# Patient Record
Sex: Female | Born: 1980 | Race: White | Hispanic: No | Marital: Married | Smoking: Current every day smoker
Health system: Southern US, Community
[De-identification: ages and names within clinical notes are randomized; demographics above are authoritative.]

## PROBLEM LIST (undated history)

## (undated) DIAGNOSIS — F329 Major depressive disorder, single episode, unspecified: Secondary | ICD-10-CM

## (undated) DIAGNOSIS — M797 Fibromyalgia: Secondary | ICD-10-CM

## (undated) DIAGNOSIS — G43909 Migraine, unspecified, not intractable, without status migrainosus: Secondary | ICD-10-CM

## (undated) DIAGNOSIS — K589 Irritable bowel syndrome without diarrhea: Secondary | ICD-10-CM

## (undated) DIAGNOSIS — E041 Nontoxic single thyroid nodule: Secondary | ICD-10-CM

## (undated) DIAGNOSIS — E0500 Thyrotoxicosis with diffuse goiter without thyrotoxic crisis or storm: Secondary | ICD-10-CM

## (undated) DIAGNOSIS — J309 Allergic rhinitis, unspecified: Secondary | ICD-10-CM

## (undated) DIAGNOSIS — N3942 Incontinence without sensory awareness: Secondary | ICD-10-CM

## (undated) HISTORY — DX: Major depressive disorder, single episode, unspecified: F32.9

## (undated) HISTORY — PX: ABDOMINAL HYSTERECTOMY: SHX81

## (undated) HISTORY — DX: Thyrotoxicosis with diffuse goiter without thyrotoxic crisis or storm: E05.00

## (undated) HISTORY — PX: PARTIAL HYSTERECTOMY: SHX80

## (undated) HISTORY — PX: OOPHORECTOMY: SHX86

## (undated) HISTORY — DX: Allergic rhinitis, unspecified: J30.9

## (undated) HISTORY — PX: TUBAL LIGATION: SHX77

## (undated) HISTORY — PX: VAGINA SURGERY: SHX829

## (undated) HISTORY — DX: Incontinence without sensory awareness: N39.42

---

## 2017-12-19 ENCOUNTER — Other Ambulatory Visit (HOSPITAL_BASED_OUTPATIENT_CLINIC_OR_DEPARTMENT_OTHER): Payer: Self-pay | Admitting: Pain Medicine

## 2017-12-19 DIAGNOSIS — R4189 Other symptoms and signs involving cognitive functions and awareness: Secondary | ICD-10-CM

## 2017-12-22 ENCOUNTER — Encounter (HOSPITAL_BASED_OUTPATIENT_CLINIC_OR_DEPARTMENT_OTHER): Payer: Self-pay

## 2017-12-22 ENCOUNTER — Ambulatory Visit (HOSPITAL_BASED_OUTPATIENT_CLINIC_OR_DEPARTMENT_OTHER): Payer: BLUE CROSS/BLUE SHIELD

## 2018-01-05 ENCOUNTER — Ambulatory Visit (HOSPITAL_BASED_OUTPATIENT_CLINIC_OR_DEPARTMENT_OTHER)
Admission: RE | Admit: 2018-01-05 | Discharge: 2018-01-05 | Disposition: A | Discharge status: Home / Self Care | Payer: BLUE CROSS/BLUE SHIELD | Source: Ambulatory Visit | Attending: Pain Medicine | Admitting: Pain Medicine

## 2018-01-05 DIAGNOSIS — R4189 Other symptoms and signs involving cognitive functions and awareness: Secondary | ICD-10-CM | POA: Diagnosis not present

## 2018-01-05 IMAGING — MR MR HEAD W/O CM
7 of 8 series · 40 of 48 positions shown · non-contrast
Comparison: None.

CLINICAL DATA: Cognitive deficit.  Chronic migraine headaches.

EXAM:
MRI HEAD WITHOUT CONTRAST
TECHNIQUE: Multiplanar, multiecho pulse sequences of the brain and surrounding
structures were obtained without intravenous contrast.

[Series 2: T1 · sagittal · 5.0mm · 0.90mm/px · 3 of 27 slices shown]
[im 1/27]
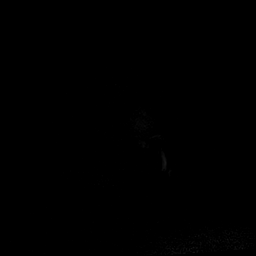
[im 14/27]
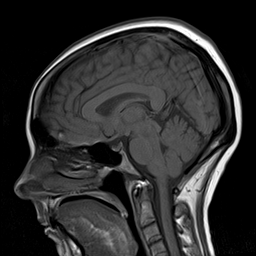
[im 27/27]
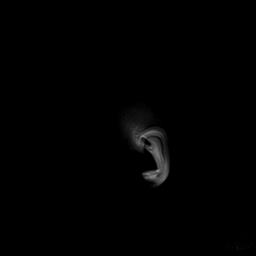

[Series 3: DWI · axial · 3.0mm · 1.88mm/px · z∈[-85,+66]mm · 13 of 96 slices shown (1 of 2)]
[im 1/96]
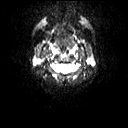
[im 8/96]
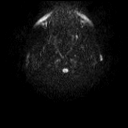
[im 16/96]
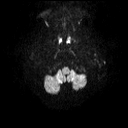
[im 24/96]
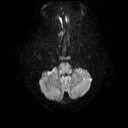
[im 32/96]
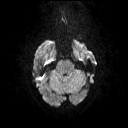
[im 40/96]
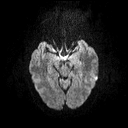
[im 48/96]
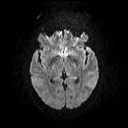
[im 56/96]
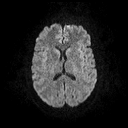
[im 64/96]
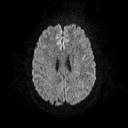
[im 72/96]
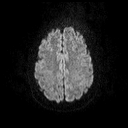
[im 80/96]
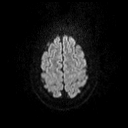
[im 88/96]
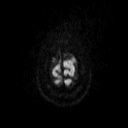
[im 96/96]
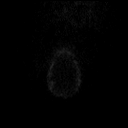

[Series 4: DWI · axial · 3.0mm · 1.88mm/px · z∈[-85,+66]mm · 6 of 48 slices shown (2 of 2)]
[im 1/48]
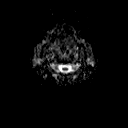
[im 10/48]
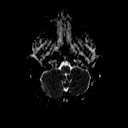
[im 19/48]
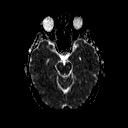
[im 29/48]
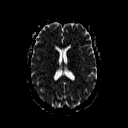
[im 38/48]
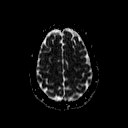
[im 48/48]
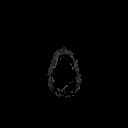

[Series 5: T2 · axial · 5.0mm · 0.69mm/px · z∈[-86,+72]mm · 4 of 28 slices shown (1 of 3)]
[im 1/28]
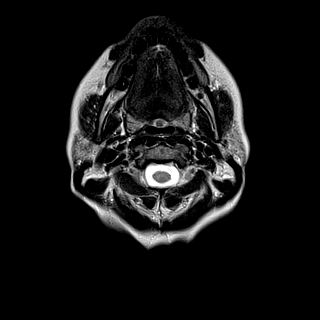
[im 10/28]
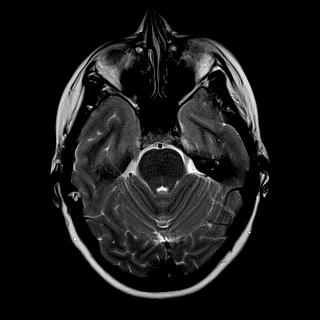
[im 19/28]
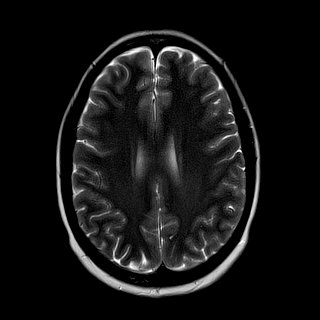
[im 28/28]
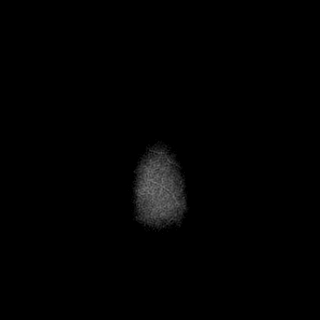

[Series 6: T2 · axial · 5.0mm · 0.43mm/px · z∈[-86,+72]mm · 4 of 28 slices shown (2 of 3)]
[im 1/28]
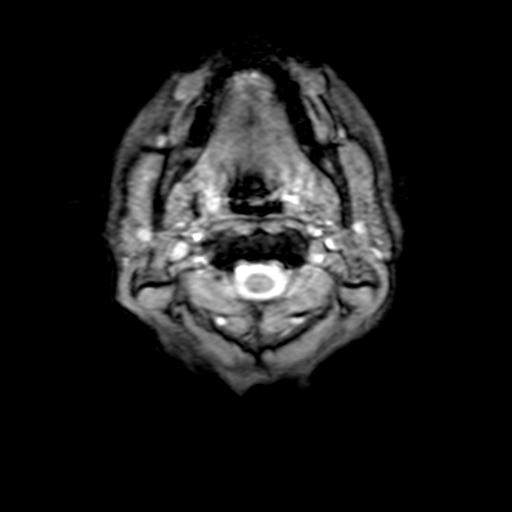
[im 10/28]
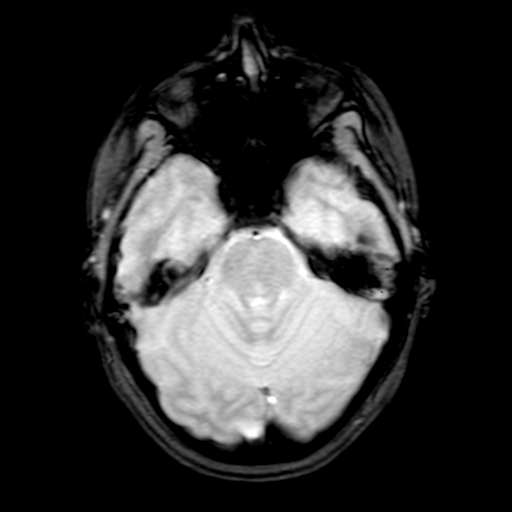
[im 19/28]
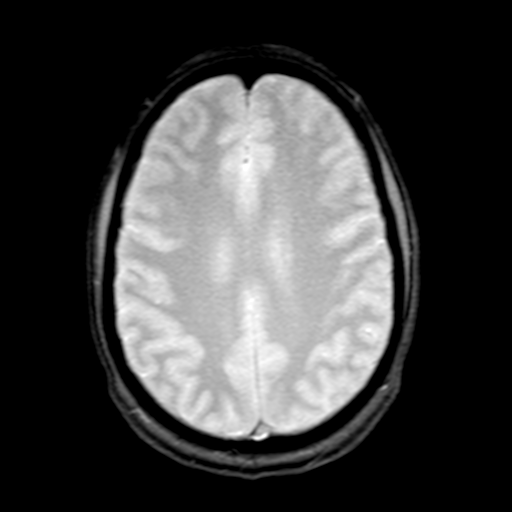
[im 28/28]
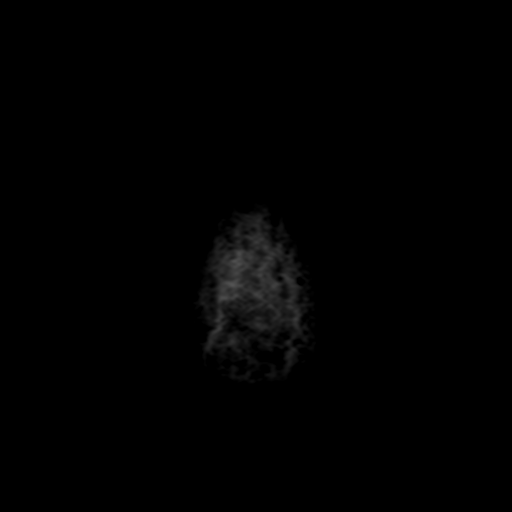

[Series 7: FLAIR · axial · 3.0mm · 0.43mm/px · z∈[-87,+73]mm · 6 of 42 slices shown]
[im 1/42]
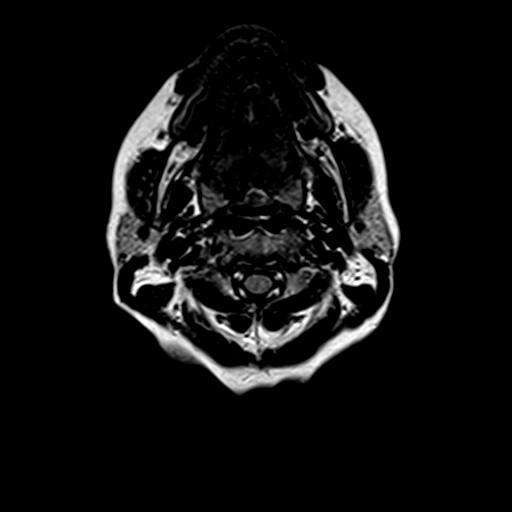
[im 9/42]
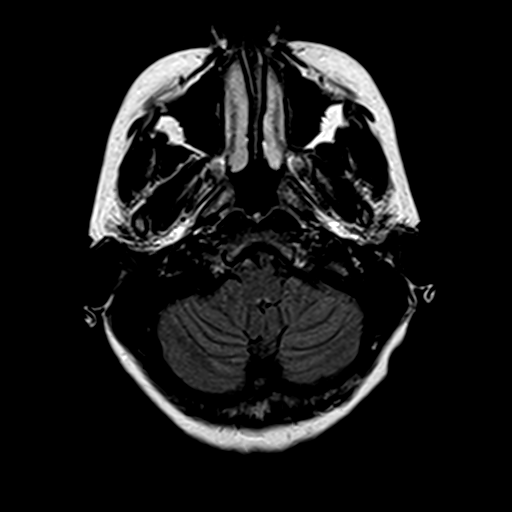
[im 17/42]
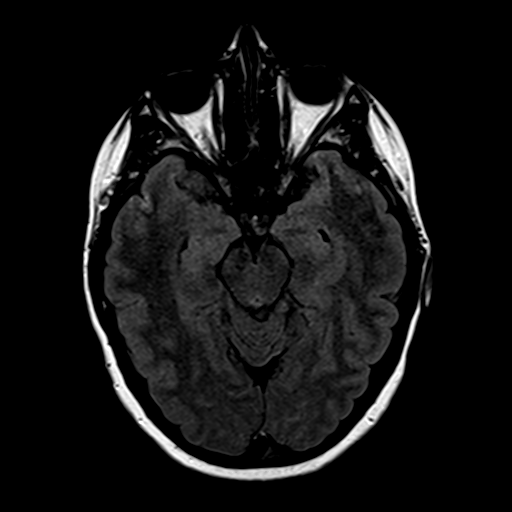
[im 25/42]
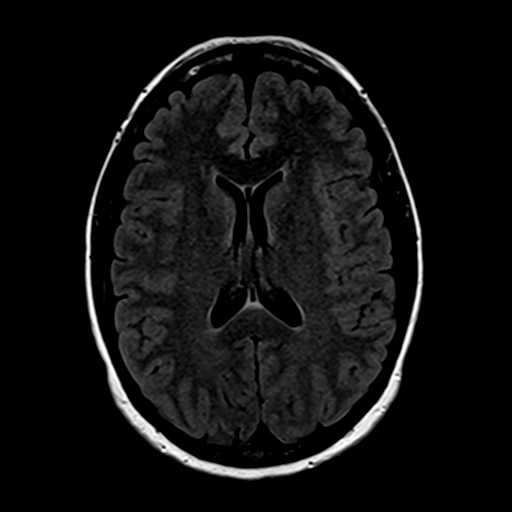
[im 33/42]
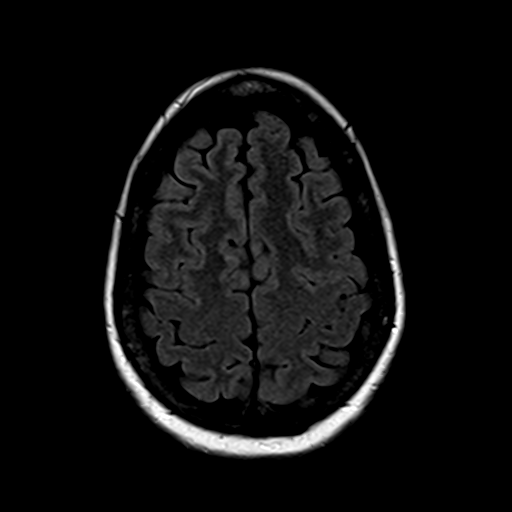
[im 42/42]
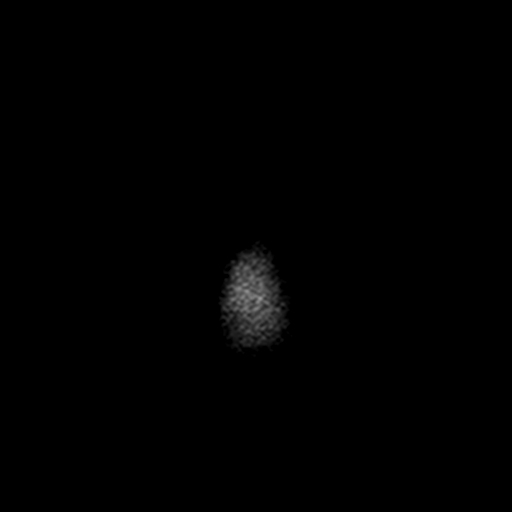

[Series 9: T2 · coronal · 5.0mm · 0.69mm/px · 4 of 30 slices shown (3 of 3)]
[im 1/30]
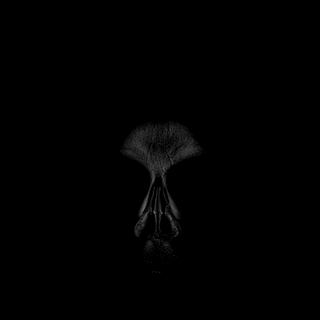
[im 10/30]
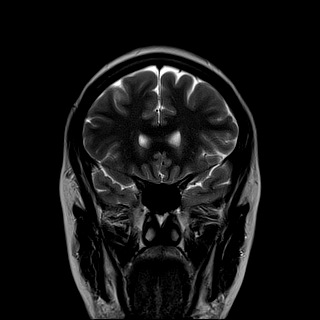
[im 20/30]
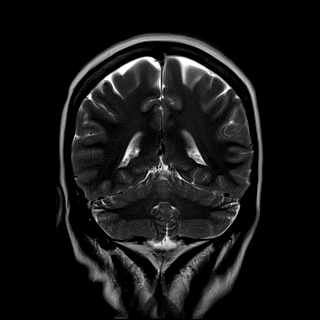
[im 30/30]
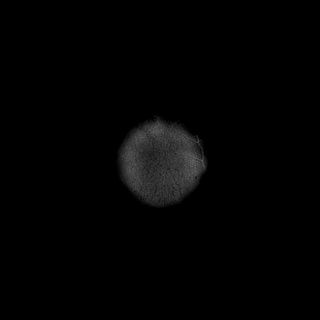

[40 of 48 positions shown; findings below may reference images not displayed]

FINDINGS: Brain: No acute infarction, hemorrhage, hydrocephalus, extra-axial
collection or mass lesion. Cerebral volume normal.

Vascular: Normal arterial flow voids

Skull and upper cervical spine: Negative

Sinuses/Orbits: Negative

Other: None
IMPRESSION: Normal MRI head.

## 2018-10-15 DIAGNOSIS — G43109 Migraine with aura, not intractable, without status migrainosus: Secondary | ICD-10-CM

## 2018-10-15 DIAGNOSIS — G43E09 Chronic migraine with aura, not intractable, without status migrainosus: Secondary | ICD-10-CM

## 2018-10-15 HISTORY — DX: Chronic migraine with aura, not intractable, without status migrainosus: G43.E09

## 2018-10-15 HISTORY — DX: Migraine with aura, not intractable, without status migrainosus: G43.109

## 2019-01-04 ENCOUNTER — Emergency Department (HOSPITAL_COMMUNITY): Payer: BLUE CROSS/BLUE SHIELD

## 2019-01-04 ENCOUNTER — Other Ambulatory Visit: Payer: Self-pay

## 2019-01-04 ENCOUNTER — Encounter (HOSPITAL_COMMUNITY): Payer: Self-pay | Admitting: *Deleted

## 2019-01-04 ENCOUNTER — Emergency Department (HOSPITAL_COMMUNITY)
Admission: EM | Admit: 2019-01-04 | Discharge: 2019-01-05 | Disposition: A | Discharge status: Home / Self Care | Payer: BLUE CROSS/BLUE SHIELD | Attending: Emergency Medicine | Admitting: Emergency Medicine

## 2019-01-04 DIAGNOSIS — J189 Pneumonia, unspecified organism: Secondary | ICD-10-CM | POA: Diagnosis not present

## 2019-01-04 DIAGNOSIS — K921 Melena: Secondary | ICD-10-CM | POA: Diagnosis not present

## 2019-01-04 DIAGNOSIS — J029 Acute pharyngitis, unspecified: Secondary | ICD-10-CM | POA: Diagnosis not present

## 2019-01-04 DIAGNOSIS — R197 Diarrhea, unspecified: Secondary | ICD-10-CM | POA: Diagnosis not present

## 2019-01-04 DIAGNOSIS — R1013 Epigastric pain: Secondary | ICD-10-CM | POA: Diagnosis not present

## 2019-01-04 DIAGNOSIS — R079 Chest pain, unspecified: Secondary | ICD-10-CM | POA: Diagnosis present

## 2019-01-04 DIAGNOSIS — Z8619 Personal history of other infectious and parasitic diseases: Secondary | ICD-10-CM | POA: Diagnosis not present

## 2019-01-04 HISTORY — DX: Migraine, unspecified, not intractable, without status migrainosus: G43.909

## 2019-01-04 HISTORY — DX: Fibromyalgia: M79.7

## 2019-01-04 HISTORY — DX: Irritable bowel syndrome, unspecified: K58.9

## 2019-01-04 HISTORY — DX: Nontoxic single thyroid nodule: E04.1

## 2019-01-04 LAB — COMPREHENSIVE METABOLIC PANEL
ALT: 14 U/L (ref 0–44)
AST: 17 U/L (ref 15–41)
Albumin: 3.8 g/dL (ref 3.5–5.0)
Alkaline Phosphatase: 104 U/L (ref 38–126)
Anion gap: 9 (ref 5–15)
BUN: <5 mg/dL — ABNORMAL LOW (ref 6–20)
CO2: 26 mmol/L (ref 22–32)
Calcium: 9 mg/dL (ref 8.9–10.3)
Chloride: 102 mmol/L (ref 98–111)
Creatinine, Ser: 0.93 mg/dL (ref 0.44–1.00)
GFR calc Af Amer: >60 mL/min (ref >60)
GFR calc non Af Amer: >60 mL/min (ref >60)
Glucose, Bld: 64 mg/dL — ABNORMAL LOW (ref 70–99)
Potassium: 3.9 mmol/L (ref 3.5–5.1)
Sodium: 137 mmol/L (ref 135–145)
Total Bilirubin: 0.5 mg/dL (ref 0.3–1.2)
Total Protein: 6.4 g/dL — ABNORMAL LOW (ref 6.5–8.1)

## 2019-01-04 LAB — CBC
HCT: 39.5 % (ref 36.0–46.0)
Hemoglobin: 13.6 g/dL (ref 12.0–15.0)
MCH: 32.7 pg (ref 26.0–34.0)
MCHC: 34.4 g/dL (ref 30.0–36.0)
MCV: 95 fL (ref 80.0–100.0)
Platelets: 365 10*3/uL (ref 150–400)
RBC: 4.16 MIL/uL (ref 3.87–5.11)
RDW: 14.6 % (ref 11.5–15.5)
WBC: 10.9 10*3/uL — ABNORMAL HIGH (ref 4.0–10.5)
nRBC: 0 % (ref 0.0–0.2)

## 2019-01-04 LAB — POC OCCULT BLOOD, ED: Fecal Occult Bld: NEGATIVE

## 2019-01-04 LAB — LIPASE, BLOOD: Lipase: 25 U/L (ref 11–51)

## 2019-01-04 LAB — D-DIMER, QUANTITATIVE: D-Dimer, Quant: 0.48 ug/mL-FEU (ref 0.00–0.50)

## 2019-01-04 LAB — I-STAT BETA HCG BLOOD, ED (MC, WL, AP ONLY): I-stat hCG, quantitative: <5 m[IU]/mL (ref <5)

## 2019-01-04 LAB — TROPONIN I (HIGH SENSITIVITY): Troponin I (High Sensitivity): <2 ng/L (ref <18)

## 2019-01-04 MED ORDER — SODIUM CHLORIDE 0.9% FLUSH: 3.0000 mL | Freq: Once | INTRAVENOUS | Status: DC

## 2019-01-04 MED ORDER — IBUPROFEN 800 MG PO TABS: 800.0000 mg | ORAL_TABLET | Freq: Three times a day (TID) | ORAL | 0 refills | Status: DC

## 2019-01-04 MED ORDER — PREDNISONE 10 MG (21) PO TBPK: ORAL_TABLET | Freq: Every day | ORAL | 0 refills | Status: DC

## 2019-01-04 MED ORDER — SODIUM CHLORIDE 0.9 % IV BOLUS
1000.0000 mL | Freq: Once | INTRAVENOUS | Status: AC
  Administered 2019-01-04: 1000 mL via INTRAVENOUS

## 2019-01-04 NOTE — ED Triage Notes (Signed)
Pt has been having intermittent sharp chest pain that has persisted as a dull pressure that is constant now. Reports dyspnea which pt appears sob when talking. Has been diagnosed with Covid x 2 over the past 2 weeks. Also has had sore throat, bloody stools, and epigastric pain

## 2019-01-04 NOTE — ED Provider Notes (Signed)
[facility] EMERGENCY DEPARTMENT Provider Note   CSN: [id number] Arrival date & time: 01/04/19  2044    History   Chief Complaint Chief Complaint  Patient presents with  . Chest Pain    HPI Lynn Lyons is a 38 y.o. female has medical history of fibromyalgia, IBS, migraines, external hemorrhoids presents emergency department today with multiple medical complaints. Of note patient has had 2+ tests for COVID.  First test was 11/29/2018 and the second test was 12/24/2018.  She has been self quarantining at home and was prescribed azithromycin that she finished approximately a month ago.   Chest pain x1 week.  Patient describes the chest pain as sharp.  Pain is located in the center of her chest.  The episodes of pain lasts approximately 2 to 3 minutes. No alleviating or modifying factors.  She is also now having epigastric pain that has been constant x2 days.  Patient states whenever she gets up to do something she feels like her heart is beating very fast and she feels short of breath.  She also feels short of breath when talking, denies worsening dyspnea with exertion.  Patient also reports diarrhea.  She has had 3 episodes of loose stool that she describes as light brown in color.  She noticed bright red blood in the toilet.   Also with sore throat. She describes as scratchy when she swallows. No radiation of pain, no difficulty swallowing, no voice changes.  Denies fever, chills, cough, hemoptysis, nausea, vomiting, difficulty breathing, history of blood clot, exogenous estrogen use, lower extremity pain or swelling, black tarry stool, recent travel or immobilization, history of PE or DVT, family or personal history of bleeding or clotting disorders.    Past Medical History:  Diagnosis Date  . Fibromyalgia   . IBS (irritable bowel syndrome)   . Migraines   . Thyroid cyst     There are no active problems to display for this patient.   Past Surgical History:   Procedure Laterality Date  . OOPHORECTOMY    . PARTIAL HYSTERECTOMY    . TUBAL LIGATION       OB History   No obstetric history on file.      Home Medications    Prior to Admission medications   Medication Sig Start Date End Date Taking? Authorizing Provider  ibuprofen (ADVIL) 800 MG tablet Take 1 tablet (800 mg total) by mouth 3 (three) times daily. 01/04/19   Ken Bonn E, PA-C  predniSONE (STERAPRED UNI-PAK 21 TAB) 10 MG (21) TBPK tablet Take by mouth daily. Take 6 tabs by mouth daily  for 2 days, then 5 tabs for 2 days, then 4 tabs for 2 days, then 3 tabs for 2 days, 2 tabs for 2 days, then 1 tab by mouth daily for 2 days 01/04/19   Nataki Mccrumb, Harley Hallmark, PA-C    Family History No family history on file.  Social History Social History   Tobacco Use  . Smoking status: Not on file  Substance Use Topics  . Alcohol use: Not on file  . Drug use: Not on file     Allergies   Patient has no known allergies.   Review of Systems Review of Systems  Constitutional: Negative for chills and fever.  HENT: Positive for sore throat. Negative for congestion, ear discharge, ear pain, sinus pressure and sinus pain.   Eyes: Negative for pain and redness.  Respiratory: Positive for shortness of breath. Negative for cough.   Cardiovascular:  Positive for chest pain.  Gastrointestinal: Positive for abdominal pain, blood in stool and nausea. Negative for constipation, diarrhea and vomiting.  Genitourinary: Negative for dysuria and hematuria.  Musculoskeletal: Negative for back pain and neck pain.  Skin: Negative for wound.  Neurological: Negative for weakness, numbness and headaches.     Physical Exam Updated Vital Signs BP (!) 118/103   Pulse (!) 105   Temp 99.2 F (37.3 C) (Oral)   Resp 20   SpO2 99%   Physical Exam Vitals signs and nursing note reviewed.  Constitutional:      General: She is not in acute distress.    Appearance: She is not ill-appearing.  HENT:      Head: Normocephalic and atraumatic.     Right Ear: Tympanic membrane and external ear normal.     Left Ear: Tympanic membrane and external ear normal.     Nose: Nose normal.     Mouth/Throat:     Mouth: Mucous membranes are moist.     Pharynx: Oropharynx is clear.     Comments: No erythema to oropharynx, no edema, no exudate, no tonsillar swelling, voice normal, neck supple without lymphadenopathy.  Eyes:     General: No scleral icterus.       Right eye: No discharge.        Left eye: No discharge.     Extraocular Movements: Extraocular movements intact.     Conjunctiva/sclera: Conjunctivae normal.     Pupils: Pupils are equal, round, and reactive to light.  Neck:     Musculoskeletal: Normal range of motion. No muscular tenderness.     Vascular: No JVD.  Cardiovascular:     Rate and Rhythm: Regular rhythm. Tachycardia present.     Pulses: Normal pulses.          Radial pulses are 2+ on the right side and 2+ on the left side.       Dorsalis pedis pulses are 2+ on the right side and 2+ on the left side.     Heart sounds: Normal heart sounds.  Pulmonary:     Comments: Lungs clear to auscultation in all fields. Symmetric chest rise. No wheezing, rales, or rhonchi. SpO2 is 100% on room air Chest:     Chest wall: Tenderness present.  Abdominal:     General: Bowel sounds are normal.     Tenderness: There is abdominal tenderness in the epigastric area. There is no right CVA tenderness or left CVA tenderness.     Comments: Abdomen is soft, non-distended. No rigidity, no guarding. No peritoneal signs.  Genitourinary:    Comments: Chaperone  NT present for exam. Digital Rectal Exam reveals sphincter with good tone. External hemorrhoids noted, no thrombosis. No masses or fissures. Stool color is brown with no overt blood. No gross melena.  Musculoskeletal: Normal range of motion.     Right lower leg: No edema.     Left lower leg: No edema.     Comments: Homans sign absent bilaterally, no  lower extremity edema, no palpable cords, compartments are soft  Lymphadenopathy:     Cervical: No cervical adenopathy.  Skin:    General: Skin is warm and dry.     Capillary Refill: Capillary refill takes less than 2 seconds.  Neurological:     Mental Status: She is oriented to person, place, and time.     GCS: GCS eye subscore is 4. GCS verbal subscore is 5. GCS motor subscore is 6.  Comments: Fluent speech, no facial droop.  Psychiatric:        Behavior: Behavior normal.      ED Treatments / Results  Labs (all labs ordered are listed, but only abnormal results are displayed) Labs Reviewed  CBC - Abnormal; Notable for the following components:      Result Value   WBC 10.9 (*)    All other components within normal limits  COMPREHENSIVE METABOLIC PANEL - Abnormal; Notable for the following components:   Glucose, Bld 64 (*)    BUN <5 (*)    Total Protein 6.4 (*)    All other components within normal limits  NOVEL CORONAVIRUS, NAA (HOSPITAL ORDER, SEND-OUT TO REF LAB)  LIPASE, BLOOD  D-DIMER, QUANTITATIVE (NOT AT [facility])  I-STAT BETA HCG BLOOD, ED ([facility], [facility], [facility] ONLY)  POC OCCULT BLOOD, ED  TROPONIN I (HIGH SENSITIVITY)    EKG EKG Interpretation  Date/Time:  Saturday January 04 2019 20:56:15 EDT Ventricular Rate:  114 PR Interval:  156 QRS Duration: 82 QT Interval:  330 QTC Calculation: 454 R Axis:   89 Text Interpretation:  Sinus tachycardia Otherwise normal ECG No previous ECGs available Confirmed by Wandra Arthurs [phone]) on 01/04/2019 10:36:12 PM   Radiology Dg Chest Portable 1 View  Result Date: 01/04/2019 CLINICAL DATA:  Initial evaluation for acute intermittent sharp chest pain, shortness of breath. Recently diagnosed with COVID. EXAM: PORTABLE CHEST 1 VIEW COMPARISON:  None. FINDINGS: Cardiac and mediastinal silhouettes are within normal limits. Lungs mildly hypoinflated. Scattered interstitial opacities present within the lower lobes bilaterally, suspected to  reflect sequelae of atypical lung infection given provided history. No pulmonary edema or pleural effusion. No pneumothorax. No acute osseous finding. IMPRESSION: Scattered interstitial opacities within the lower lobes bilaterally, suspected to reflect sequelae of atypical lung infection/viral pneumonitis given provided history. Electronically Signed   By: Jeannine Boga M.D.   On: 01/04/2019 22:46    Procedures Procedures (including critical care time)  Medications Ordered in ED Medications  sodium chloride 0.9 % bolus 1,000 mL (0 mLs Intravenous Stopped 01/05/19 0033)     Initial Impression / Assessment and Plan / ED Course  I have reviewed the triage vital signs and the nursing notes.  Pertinent labs & imaging results that were available during my care of the patient were reviewed by me and considered in my medical decision making (see chart for details).  38 yo female presents with multiple medical complaints.  On arrival she has low-grade fever of 99.2 and tachycardia to 119.  She is not hypoxic, not tachypneic.  Lungs are clear to auscultation in all fields and SpO2 is 99% on room air during exam.  Chest pain is reproducible on exam, tenderness to palpation of epigastric area without peritoneal signs. Rectal exam without gross melena.  DDx includes URI, pneumonia, COVID, PE, ACS, pancreatitis, GI bleed, anemia.  Work-up with CMP unremarkable.  Negative d-dimer.  CBC with leukocytosis of 10.9, lipase within normal range, fecal occult negative, troponin negative.  As chest pain has been going on for 1 week ACS is unlikely etiology. EKG without ischemic changes.  I ambulated patient around the exam room.  She did so with steady gait in no distress.  SpO2 was 97-100% on room air while ambulating.  Low suspicion for PE given negative dimer and physical exam findings. Chest xray viewed by me with scattered interstitial opacities within the lower lobes bilaterally. Radiologist reports  suspected sequelae of atypical lung infection/viral pneumonitis. As pt has  been afebrile at home and only temp of 99.2 in ED, no cough, clear lungs, no increased work of breathing will treat as pneumonitis with steroids and NSAIDs. Outpatient covid test sent, pt aware she needs to continue to selfquarantine until she has test result.  Tachycardia improved after fluids. Pt continues to have normal work of breathing on reassessment. I had lengthy discussion with pt about ED return precautions. Pt is stable for discharge home and agrees with plan. Pt will need close follow up with pcp. I recommend seeing pcp in 1-2 days for recheck, pt agrees to plan. Findings and plan of care discussed with supervising physician Dr. Mariane Masters.   Kiamesha Samet was evaluated in Emergency Department on 01/05/2019 for the symptoms described in the history of present illness. She was evaluated in the context of the global COVID-19 pandemic, which necessitated consideration that the patient might be at risk for infection with the SARS-CoV-2 virus that causes COVID-19. Institutional protocols and algorithms that pertain to the evaluation of patients at risk for COVID-19 are in a state of rapid change based on information released by regulatory bodies including the CDC and federal and state organizations. These policies and algorithms were followed during the patient's care in the ED.  This note was prepared using Dragon voice recognition software and may include unintentional dictation errors due to the inherent limitations of voice recognition software.     Final Clinical Impressions(s) / ED Diagnoses   Final diagnoses:  Nonspecific chest pain  Pneumonitis    ED Discharge Orders         Ordered    predniSONE (STERAPRED UNI-PAK 21 TAB) 10 MG (21) TBPK tablet  Daily     01/04/19 2359    ibuprofen (ADVIL) 800 MG tablet  3 times daily     01/04/19 2359           Cherre Robins, PA-C 01/05/19 1157     Varney Biles, MD 01/06/19 1205

## 2019-01-04 NOTE — Discharge Instructions (Addendum)
You have been seen today for chest pain, shortness of breath. Please read and follow all provided instructions. Return to the emergency room for worsening condition or new concerning symptoms including worsening shortness of breath or difficulty breathing, fever you cannot control, worsening chest pain  Your chest x-ray today showed possible pneumonitis.  This is treated with steroids and anti-inflammatories.  1. Medications:  -Prescription sent to your pharmacy for steroid Dosepak.  Please take this as prescribed. -Prescription also sent for ibuprofen.  Please take as prescribed.  Recommend you take with food as to not upset your stomach. Continue usual home medications Take medications as prescribed. Please review all of the medicines and only take them if you do not have an allergy to them.   2. Treatment: rest, drink plenty of fluids  3. Follow Up: Please follow up with your primary doctor in 1-2 days for discussion of your diagnoses and further evaluation after today's visit; call first thing Monday morning to schedule an appointment to be seen in the beginning of the week.. It is very important that you follow-up and have your vital signs rechecked and discuss your emergency room visit.  It is also a possibility that you have an allergic reaction to any of the medicines that you have been prescribed - Everybody reacts differently to medications and while MOST people have no trouble with most medicines, you may have a reaction such as nausea, vomiting, rash, swelling, shortness of breath. If this is the case, please stop taking the medicine immediately and contact your physician.  ?

## 2019-01-05 NOTE — ED Notes (Signed)
Discharge instructions and follow up care discussed with pt. Pt verbalized understanding no questions at this time. Pt to go home with significant other

## 2019-01-08 LAB — NOVEL CORONAVIRUS, NAA (HOSP ORDER, SEND-OUT TO REF LAB; TAT 18-24 HRS): SARS-CoV-2, NAA: NOT DETECTED

## 2019-06-17 DIAGNOSIS — F17210 Nicotine dependence, cigarettes, uncomplicated: Secondary | ICD-10-CM

## 2019-06-17 HISTORY — DX: Nicotine dependence, cigarettes, uncomplicated: F17.210

## 2019-07-03 DIAGNOSIS — G9331 Postviral fatigue syndrome: Secondary | ICD-10-CM | POA: Insufficient documentation

## 2019-07-03 DIAGNOSIS — G933 Postviral fatigue syndrome: Secondary | ICD-10-CM | POA: Insufficient documentation

## 2019-07-03 HISTORY — DX: Postviral fatigue syndrome: G93.31

## 2019-07-17 DIAGNOSIS — C4491 Basal cell carcinoma of skin, unspecified: Secondary | ICD-10-CM

## 2019-07-17 HISTORY — DX: Basal cell carcinoma of skin, unspecified: C44.91

## 2020-08-13 NOTE — Progress Notes (Signed)
Name: Lynn Lyons  MRN/ DOB: [id number], [birth year]    Age/ Sex: 40 y.o., female    PCP: Imagene Riches, NP   Reason for Endocrinology Evaluation: Hyperthyroidism     Date of Initial Endocrinology Evaluation: 08/16/2020     HPI: Lynn Lyons is a 40 y.o. female with a past medical history of fibromyalgia , IBS . The patient presented for initial endocrinology clinic visit on 08/16/2020 for consultative assistance with her Hyperthyroidism.   Pt diagnosed with hyperthyroidism with a TSH of 0.34 uIU/Ml with local neck swelling, tenderness and palpitation in 06/2020    She has noted thyromegaly ~ 2 yrs ago , but her uptake and scan was normal at the time.   Has Upper EGD for dysphagia, S/P stretching  She lost ~ 40 lbs sine last summer  Has occasional palpitations  Has frequent diarrhea  As well as fecal incontinence for years   Has tremors.   No Biotin   No FH of thyroid disease   S/P hystrectomy      HISTORY:  Past Medical History:  Past Medical History:  Diagnosis Date  . Fibromyalgia   . IBS (irritable bowel syndrome)   . Migraines   . Thyroid cyst    Past Surgical History:  Past Surgical History:  Procedure Laterality Date  . OOPHORECTOMY    . PARTIAL HYSTERECTOMY    . TUBAL LIGATION        Social History:  reports that she has been smoking cigarettes. She has never used smokeless tobacco. She reports current alcohol use.  Family History: family history is not on file.   HOME MEDICATIONS: Allergies as of 08/16/2020      Reactions   Moxifloxacin Shortness Of Breath   Amitriptyline Other (See Comments)   "Can't remember"   Gabapentin Other (See Comments)   Swelling   Milnacipran Other (See Comments)   "Can't remember"   Pregabalin    Other reaction(s): Other (See Comments) Swelling   Valproic Acid Swelling      Medication List       Accurate as of August 16, 2020  2:13 PM. If you have any questions, ask your nurse or doctor.         STOP taking these medications   ibuprofen 800 MG tablet Commonly known as: ADVIL Stopped by: Dorita Sciara, MD   predniSONE 10 MG (21) Tbpk tablet Commonly known as: STERAPRED UNI-PAK 21 TAB Stopped by: Dorita Sciara, MD     TAKE these medications   clindamycin 300 MG capsule Commonly known as: CLEOCIN Take 600 mg by mouth 2 (two) times daily.   cyclobenzaprine 10 MG tablet Commonly known as: FLEXERIL Take 10 mg by mouth 2 (two) times daily.   levocetirizine 5 MG tablet Commonly known as: XYZAL Take 1 tablet by mouth daily.   meloxicam 15 MG tablet Commonly known as: MOBIC Take 15 mg by mouth daily.   oxybutynin 10 MG 24 hr tablet Commonly known as: DITROPAN-XL Take 10 mg by mouth at bedtime.   traZODone 50 MG tablet Commonly known as: DESYREL TAKE 1/2 A TABLET BY MOUTH AT BEDTIME   venlafaxine XR 37.5 MG 24 hr capsule Commonly known as: EFFEXOR-XR Take 37.5 mg by mouth at bedtime.         REVIEW OF SYSTEMS: A comprehensive ROS was conducted with the patient and is negative except as per HPI    OBJECTIVE:  VS: BP (!) 142/82   Pulse 95  Ht 5\' 4"  (1.626 m)   Wt 162 lb 8 oz (73.7 kg)   SpO2 95%   BMI 27.89 kg/m    Wt Readings from Last 3 Encounters:  08/16/20 162 lb 8 oz (73.7 kg)     EXAM: General: Pt appears well and is in NAD  Eyes: External eye exam normal without stare, lid lag or exophthalmos.  EOM intact.  PERRL.  Neck: General: Supple without adenopathy. Thyroid: Thyroid size normal.  No goiter or nodules appreciated. No thyroid bruit.  Lungs: Clear with good BS bilat with no rales, rhonchi, or wheezes  Heart: Auscultation: RRR.  Abdomen: Normoactive bowel sounds, soft, nontender, without masses or organomegaly palpable  Extremities:  BL LE: No pretibial edema normal ROM and strength.  Skin: Hair: Texture and amount normal with gender appropriate distribution Skin Inspection: No rashes Skin Palpation: Skin  temperature, texture, and thickness normal to palpation  Neuro: Cranial nerves: II - XII grossly intact  Motor: Normal strength throughout DTRs: 2+ and symmetric in UE without delay in relaxation phase  Mental Status: Judgment, insight: Intact Orientation: Oriented to time, place, and person Mood and affect: No depression, anxiety, or agitation     DATA REVIEWED:   Results for Lynn Lyons, Lynn Lyons (MRN [id number]) as of 08/17/2020 13:51  Ref. Range 08/16/2020 12:04  TSH Latest Ref Range: 0.450 - 4.500 uIU/mL 0.311 (L)  T4,Free(Direct) Latest Ref Range: 0.82 - 1.77 ng/dL 1.04  Thyrotropin Receptor Ab Latest Ref Range: 0.00 - 1.75 IU/L 1.17      Thyroid uptake and scan 08/03/2020  Uniform uptake within enlarged thyroid gland  4-hr I-131 uptake 15.4%  24- hr I- 131 37 %   ASSESSMENT/PLAN/RECOMMENDATIONS:   1. Hyperthyroidism   - Secondary to Graves' Disease - Pt with non-specific symptoms that could be attributed to her thyroid  We discussed that Graves' Disease is a result of an autoimmune condition involving the thyroid.    We discussed with pt the benefits of methimazole in the Tx of hyperthyroidism, as well as the possible side effects/complications of anti-thyroid drug Tx (specifically detailing the rare, but serious side effect of agranulocytosis). She was informed of need for regular thyroid function monitoring while on methimazole to ensure appropriate dosage without over-treatment. As well, we discussed the possible side effects of methimazole including the chance of rash, the small chance of liver irritation/juandice and the <=1 in 300-400 chance of sudden onset agranulocytosis.  We discussed importance of going to ED promptly (and stopping methimazole) if shewere to develop significant fever with severe sore throat of other evidence of acute infection.     We extensively discussed the various treatment options for hyperthyroidism and Graves disease including ablation therapy with  radioactive iodine versus antithyroid drug treatment versus surgical therapy.  We recommended to the patient that we felt, at this time, that thionamide  therapy would be most optimal.  We discussed the various possible benefits versus side effects of the various therapies.   I carefully explained to the patient that one of the consequences of I-131 ablation treatment would likely be permanent hypothyroidism which would require long-term replacement therapy with LT4.  - She was provided with printed lab orders to be used in [place] in 6 weeks if needed  - Her TSH is slightly low but despite a normal FT4 and t3 , I would recommend  Proceeding with treatment given symptoms    Medications : Start Methimazole 5 mg, HALF a tablet daily    F/U in 3  months  Labs in 6 weeks    Signed electronically by: Mack Guise, MD  [facility] Endocrinology  [facility] [address] [place], [place] [address] Phone: [phone] FAX: [phone]   CC: Imagene Riches, [place] [address] Phone: [phone] Fax: [phone]   Return to Endocrinology clinic as below: Future Appointments  Date Time Provider [facility]  09/08/2020  8:00 AM Collier Salina, MD [facility] None  09/20/2020  9:00 AM Melvenia Beam, MD [facility] None  11/11/2020  8:30 AM Valentin Benney, Melanie Crazier, MD [facility] None  12/02/2020  2:00 PM Warren Danes, PA-C [facility]

## 2020-08-16 ENCOUNTER — Encounter: Payer: Self-pay | Admitting: Internal Medicine

## 2020-08-16 ENCOUNTER — Other Ambulatory Visit: Payer: Self-pay

## 2020-08-16 ENCOUNTER — Ambulatory Visit (INDEPENDENT_AMBULATORY_CARE_PROVIDER_SITE_OTHER): Payer: 59 | Admitting: Internal Medicine

## 2020-08-16 VITALS — BP 142/82 | HR 95 | Ht 64.0 in | Wt 162.5 lb

## 2020-08-16 DIAGNOSIS — E0590 Thyrotoxicosis, unspecified without thyrotoxic crisis or storm: Secondary | ICD-10-CM

## 2020-08-16 DIAGNOSIS — E0500 Thyrotoxicosis with diffuse goiter without thyrotoxic crisis or storm: Secondary | ICD-10-CM | POA: Diagnosis not present

## 2020-08-16 NOTE — Patient Instructions (Signed)
-   Please stop by the lab today  

## 2020-08-17 DIAGNOSIS — E0500 Thyrotoxicosis with diffuse goiter without thyrotoxic crisis or storm: Secondary | ICD-10-CM | POA: Insufficient documentation

## 2020-08-17 DIAGNOSIS — E0590 Thyrotoxicosis, unspecified without thyrotoxic crisis or storm: Secondary | ICD-10-CM

## 2020-08-17 HISTORY — DX: Thyrotoxicosis, unspecified without thyrotoxic crisis or storm: E05.90

## 2020-08-17 LAB — THYROTROPIN RECEPTOR AUTOABS: Thyrotropin Receptor Ab: 1.17 IU/L (ref 0.00–1.75)

## 2020-08-17 LAB — TSH: TSH: 0.311 u[IU]/mL — ABNORMAL LOW (ref 0.450–4.500)

## 2020-08-17 LAB — T4, FREE: Free T4: 1.04 ng/dL (ref 0.82–1.77)

## 2020-08-17 MED ORDER — METHIMAZOLE 5 MG PO TABS: 2.5000 mg | ORAL_TABLET | Freq: Every day | ORAL | 1 refills | Status: DC

## 2020-08-27 ENCOUNTER — Other Ambulatory Visit: Payer: Self-pay | Admitting: Family

## 2020-08-27 ENCOUNTER — Other Ambulatory Visit (HOSPITAL_COMMUNITY): Payer: Self-pay | Admitting: Family

## 2020-08-27 DIAGNOSIS — N3942 Incontinence without sensory awareness: Secondary | ICD-10-CM

## 2020-08-27 DIAGNOSIS — M545 Low back pain, unspecified: Secondary | ICD-10-CM

## 2020-08-31 ENCOUNTER — Ambulatory Visit (HOSPITAL_COMMUNITY)
Admission: RE | Admit: 2020-08-31 | Discharge: 2020-08-31 | Disposition: A | Discharge status: Home / Self Care | Payer: 59 | Source: Ambulatory Visit | Attending: Family | Admitting: Family

## 2020-08-31 ENCOUNTER — Other Ambulatory Visit: Payer: Self-pay

## 2020-08-31 DIAGNOSIS — M545 Low back pain, unspecified: Secondary | ICD-10-CM | POA: Insufficient documentation

## 2020-08-31 DIAGNOSIS — N3942 Incontinence without sensory awareness: Secondary | ICD-10-CM | POA: Diagnosis present

## 2020-09-01 ENCOUNTER — Other Ambulatory Visit: Payer: Self-pay | Admitting: Family

## 2020-09-01 DIAGNOSIS — N63 Unspecified lump in unspecified breast: Secondary | ICD-10-CM

## 2020-09-07 NOTE — Progress Notes (Signed)
Office Visit Note  Patient: Lynn Lyons             Date of Birth: [birth year]           MRN: [id number]             PCP: Imagene Riches, NP Referring: Imagene Riches, NP Visit Date: 09/08/2020   Subjective:  New Patient (Initial Visit) (Patient complains of fatigue, joint and muscle pain, and confusion/fibro fog. Patient also has symptoms related to Graves' Disease. )   History of Present Illness: Lynn Lyons is a 40 y.o. female with a history of migraines, allergic rhinitis, nephrolithiasis, PID, RMSF and chronic pain syndrome here for evaluation of fibromyalgia syndrome and ruling out other problems. She has symptoms for at least 3 years but significant worsening since around November of last year. She has pain in multiple sites especially in the back, upper arms, and knees but is more concerned with episodic weakness or numbness affecting her hands and feet. This is severe enough to stop her from being able to use her hands on tasks such as craft projcets or drop items she is holding. She has tried several medications for fibromyalgia or neuropathy including cymbalta, amitriptyline, gabapentin with frequent sensitivity and swelling reactions. She was most recently diagnosed with grave's disease with low TSH, diffuse enhanced uptake on thyroid scan and her symptoms. Also noted to have significantly low vitamin B12 consistent with this disorder.  Labs reviewed 06/2020 ANA neg RF neg TSH 0.344 fT3 wnl fT4 wnl CK wnl CMP wnl ESR 22 Uric acid 5.1 Vit B12 78 (200-900)    Activities of Daily Living:  Patient reports morning stiffness for several hours to all day.   Patient Reports nocturnal pain.  Difficulty dressing/grooming: Reports Difficulty climbing stairs: Reports Difficulty getting out of chair: Reports Difficulty using hands for taps, buttons, cutlery, and/or writing: Reports  Review of Systems  Constitutional: Positive for fatigue.  HENT: Positive for mouth  dryness and nose dryness. Negative for mouth sores.   Eyes: Positive for pain, itching, visual disturbance and dryness.  Respiratory: Positive for shortness of breath and difficulty breathing. Negative for cough and hemoptysis.   Cardiovascular: Positive for chest pain, palpitations and swelling in legs/feet.  Gastrointestinal: Positive for abdominal pain, constipation and diarrhea. Negative for blood in stool.  Endocrine: Negative for increased urination.  Genitourinary: Negative for painful urination.  Musculoskeletal: Positive for arthralgias, joint pain, joint swelling, myalgias, muscle weakness, morning stiffness, muscle tenderness and myalgias.  Skin: Positive for color change. Negative for rash and redness.  Allergic/Immunologic: Negative for susceptible to infections.  Neurological: Positive for dizziness, numbness, headaches, memory loss and weakness.  Hematological: Positive for swollen glands.  Psychiatric/Behavioral: Positive for confusion and sleep disturbance.    PMFS History:  Patient Active Problem List   Diagnosis Date Noted   Fibromyalgia syndrome 09/08/2020   Patellofemoral pain syndrome of both knees 09/08/2020   Dry mouth 09/08/2020   Graves disease 08/17/2020   Hyperthyroidism 08/17/2020   Post viral syndrome 07/03/2019   Cigarette smoker 06/17/2019   Chronic migraine with aura 10/15/2018    Past Medical History:  Diagnosis Date   Fibromyalgia    Graves disease    IBS (irritable bowel syndrome)    Migraines    Thyroid cyst     Family History  Problem Relation Age of Onset   Cancer Mother        Colorectal   Crohn's disease Mother    Hypertension  Mother    Past Surgical History:  Procedure Laterality Date   CHOLECYSTECTOMY  2004   OOPHORECTOMY     PARTIAL HYSTERECTOMY     SKIN CANCER EXCISION  2021   TUBAL LIGATION     Social History   Social History Narrative   Not on file    There is no immunization history on file  for this patient.   Objective: Vital Signs: BP 122/82 (BP Location: Left Arm, Patient Position: Sitting, Cuff Size: Normal)    Pulse (!) 102    Ht 5' 3" (1.6 m)    Wt 163 lb 3.2 oz (74 kg)    BMI 28.91 kg/m    Physical Exam HENT:     Right Ear: External ear normal.     Left Ear: External ear normal.     Mouth/Throat:     Mouth: Mucous membranes are moist.     Pharynx: Oropharynx is clear.  Eyes:     Conjunctiva/sclera: Conjunctivae normal.  Cardiovascular:     Rate and Rhythm: Regular rhythm. Tachycardia present.  Pulmonary:     Effort: Pulmonary effort is normal.     Breath sounds: Normal breath sounds.  Skin:    General: Skin is warm and dry.     Findings: No rash.  Neurological:     General: No focal deficit present.     Mental Status: She is alert.     Deep Tendon Reflexes: Reflexes normal.  Psychiatric:        Mood and Affect: Mood normal.    Musculoskeletal Exam:  Neck, shoulders, elbows full ROM Wrists full ROM no tenderness or swelling Fingers full ROM no tenderness or swelling Knees full ROM patellofemoral crepitus present without tenderness or swelling  Investigation: No additional findings.  Imaging: MR LUMBAR SPINE WO CONTRAST  Result Date: 09/01/2020 CLINICAL DATA:  Low back pain over the last 6 months. Incontinence. Bilateral leg numbness. EXAM: MRI LUMBAR SPINE WITHOUT CONTRAST TECHNIQUE: Multiplanar, multisequence MR imaging of the lumbar spine was performed. No intravenous contrast was administered. COMPARISON:  Radiography 01/02/2018 FINDINGS: Segmentation:  5 lumbar type vertebral bodies. Alignment:  Normal Vertebrae:  Normal Conus medullaris and cauda equina: Conus extends to the L1-2 level. Conus and cauda equina appear normal. Paraspinal and other soft tissues: Normal Disc levels: Intervertebral disc levels are normal. No disc degeneration, bulge or herniation. No stenosis of the canal or foramina. No facet arthropathy. Nerve root distribution within  the thecal sac within normal limits. IMPRESSION: Normal examination. Electronically Signed   By: Nelson Chimes M.D.   On: 09/01/2020 08:34    Recent Labs: Lab Results  Component Value Date   WBC 10.9 (H) 01/04/2019   HGB 13.6 01/04/2019   PLT 365 01/04/2019   NA 137 01/04/2019   K 3.9 01/04/2019   CL 102 01/04/2019   CO2 26 01/04/2019   GLUCOSE 64 (L) 01/04/2019   BUN <5 (L) 01/04/2019   CREATININE 0.93 01/04/2019   BILITOT 0.5 01/04/2019   ALKPHOS 104 01/04/2019   AST 17 01/04/2019   ALT 14 01/04/2019   PROT 6.4 (L) 01/04/2019   ALBUMIN 3.8 01/04/2019   CALCIUM 9.0 01/04/2019   GFRAA >60 01/04/2019    Speciality Comments: No specialty comments available.  Procedures:  No procedures performed Allergies: Moxifloxacin, Amitriptyline, Gabapentin, Milnacipran, Pregabalin, and Valproic acid   Assessment / Plan:     Visit Diagnoses: Fibromyalgia syndrome  I suspect the peripheral coordination change or episodes of weakness are related to  vitamin B12 deficiency and thyroid disease. She does also have generalized FMS symptoms especially fatigue, all over pain, headaches, irritable bowels. Unfortunately not tolerant of multiple medications for this. Recommended she review [place] fibroguide site for self-care recommendations.  Patellofemoral pain syndrome of both knees  Anterior knee pain provoked worst in highly flexed position and crepitus on exam. She already reports a fair amount of exercise including walking multiple dogs. Provided reference material on condition and treatment options such as brace or sleeve, ice, NSAIDs.  Dry mouth  Persistent dry mouth without any lesions or dental complications and no signs of sialadenitis. Recommend OTC supportive treatment at this time sugar free gum, lozenges, or biotene mouth spray or rinse for saliva stimulation.  Orders: No orders of the defined types were placed in this encounter.  No orders of the defined types  were placed in this encounter.    Follow-Up Instructions: No follow-ups on file.   Collier Salina, MD  Note - This record has been created using [employer].  Chart creation errors have been sought, but may not always  have been located. Such creation errors do not reflect on  the standard of medical care.

## 2020-09-08 ENCOUNTER — Other Ambulatory Visit: Payer: Self-pay

## 2020-09-08 ENCOUNTER — Ambulatory Visit: Payer: 59 | Admitting: Internal Medicine

## 2020-09-08 ENCOUNTER — Encounter: Payer: Self-pay | Admitting: Internal Medicine

## 2020-09-08 DIAGNOSIS — M222X1 Patellofemoral disorders, right knee: Secondary | ICD-10-CM

## 2020-09-08 DIAGNOSIS — R682 Dry mouth, unspecified: Secondary | ICD-10-CM

## 2020-09-08 DIAGNOSIS — M222X2 Patellofemoral disorders, left knee: Secondary | ICD-10-CM

## 2020-09-08 DIAGNOSIS — M797 Fibromyalgia: Secondary | ICD-10-CM

## 2020-09-08 HISTORY — DX: Dry mouth, unspecified: R68.2

## 2020-09-08 HISTORY — DX: Patellofemoral disorders, right knee: M22.2X1

## 2020-09-08 HISTORY — DX: Patellofemoral disorders, left knee: M22.2X2

## 2020-09-08 NOTE — Patient Instructions (Addendum)
I recommend checking out the [place] patient-centered guide for fibromyalgia and chronic pain management: [employer]arkStatistics.com.ee  I do not see evidence of inflammatory joint or skin disease changes on examination today. You have had recent laboratory testing that show not indicate any problems besides the thyroid disease and B12, which can cause neuropathy.  For dry mouth I recommend sugar free gums and lozenges can stimulate saliva production. Over the counter biotene mouth spray or mouth rinse also stimulate saliva production to help dry mouth.  The knee pain looks like a problem called chondrolmalacia patellae, or patellofemoral pain syndrome.  Patellofemoral Pain Syndrome  Patellofemoral pain syndrome is a condition in which the tissue (cartilage) on the underside of the kneecap (patella) softens or breaks down. This causes pain in the front of the knee. The condition is also called runner's knee or chondromalacia patella. Patellofemoral pain syndrome is most common in young adults who are active in sports. The knee is the largest joint in the body. The patella covers the front of the knee and is attached to muscles above and below the knee. The underside of the patella is covered with a smooth type of cartilage (synovium). The smooth surface helps the patella glide easily when you move your knee. Patellofemoral pain syndrome causes swelling in the joint linings and bone surfaces in the knee. What are the causes? This condition may be caused by:  Overuse of the knee.  Poor alignment of your knee joints.  Weak leg muscles.  A direct hit to your kneecap. What increases the risk? You are more likely to develop this condition if:  You do a lot of activities that can wear down your kneecap. These include: ? Running. ? Squatting. ? Climbing stairs.  You start a new physical activity or exercise program.  You wear shoes that do not fit well.  You do not have good leg  strength.  You are overweight. What are the signs or symptoms? The main symptom of this condition is knee pain. This may feel like a dull, aching pain underneath your patella, in the front of your knee. There may be a popping or cracking sound when you move your knee. Pain may get worse with:  Exercise.  Climbing stairs.  Running.  Jumping.  Squatting.  Kneeling.  Sitting for a long time.  Moving or pushing on your patella. How is this diagnosed? This condition may be diagnosed based on:  Your symptoms and medical history. You may be asked about your recent physical activities and which ones cause knee pain.  A physical exam. This may include: ? Moving your patella back and forth. ? Checking your range of knee motion. ? Having you squat or jump to see if you have pain. ? Checking the strength of your leg muscles.  Imaging tests to confirm the diagnosis. These may include an MRI of your knee. How is this treated? This condition may be treated at home with rest, ice, compression, and elevation (RICE).  Other treatments may include:  NSAIDs, such as ibuprofen.  Physical therapy to stretch and strengthen your leg muscles.  Shoe inserts (orthotics) to take stress off your knee.  A knee brace or knee support.  Adhesive tapes to the skin.  Surgery to remove damaged cartilage or move the patella to a better position. This is rare. Follow these instructions at home: If you have a brace:  Wear the brace as told by your health care provider. Remove it only as told by  your health care provider.  Loosen the brace if your toes tingle, become numb, or turn cold and blue.  Keep the brace clean.  If the brace is not waterproof: ? Do not let it get wet. ? Cover it with a watertight covering when you take a bath or a shower. Managing pain, stiffness, and swelling  If directed, put ice on the painful area. To do this: ? If you have a removable brace, remove it as told by  your health care provider. ? Put ice in a plastic bag. ? Place a towel between your skin and the bag. ? Leave the ice on for 20 minutes, 2-3 times a day. ? Remove the ice if your skin turns bright red. This is very important. If you cannot feel pain, heat, or cold, you have a greater risk of damage to the area.  Move your toes often to reduce stiffness and swelling.  Raise (elevate) the injured area above the level of your heart while you are sitting or lying down.   Activity  Rest your knee.  Avoid activities that cause knee pain.  Perform stretching and strengthening exercises as told by your health care provider or physical therapist.  Return to your normal activities as told by your health care provider. Ask your health care provider what activities are safe for you. General instructions  Take over-the-counter and prescription medicines only as told by your health care provider.  Use splints, braces, knee supports, or walking aids as directed by your health care provider.  Do not use any products that contain nicotine or tobacco, such as cigarettes, e-cigarettes, and chewing tobacco. These can delay healing. If you need help quitting, ask your health care provider.  Keep all follow-up visits. This is important. Contact a health care provider if:  Your symptoms get worse.  You are not improving with home care. Summary  Patellofemoral pain syndrome is a condition in which the tissue (cartilage) on the underside of the kneecap (patella) softens or breaks down.  This condition causes swelling in the joint linings and bone surfaces in the knee. This leads to pain in the front of the knee.  This condition may be treated at home with rest, ice, compression, and elevation (RICE).  Use splints, braces, knee supports, or walking aids as directed by your health care provider. This information is not intended to replace advice given to you by your health care provider. Make sure you  discuss any questions you have with your health care provider. Document Revised: 11/26/2019 Document Reviewed: 11/26/2019 Elsevier Patient Education  2021 [employer].

## 2020-09-20 ENCOUNTER — Ambulatory Visit: Payer: 59 | Admitting: Neurology

## 2020-09-20 ENCOUNTER — Encounter: Payer: Self-pay | Admitting: Neurology

## 2020-09-20 VITALS — BP 122/84 | HR 101 | Ht 63.0 in | Wt 164.0 lb

## 2020-09-20 DIAGNOSIS — R2689 Other abnormalities of gait and mobility: Secondary | ICD-10-CM | POA: Diagnosis not present

## 2020-09-20 DIAGNOSIS — R270 Ataxia, unspecified: Secondary | ICD-10-CM

## 2020-09-20 DIAGNOSIS — R200 Anesthesia of skin: Secondary | ICD-10-CM | POA: Diagnosis not present

## 2020-09-20 DIAGNOSIS — M6281 Muscle weakness (generalized): Secondary | ICD-10-CM

## 2020-09-20 DIAGNOSIS — W19XXXA Unspecified fall, initial encounter: Secondary | ICD-10-CM

## 2020-09-20 DIAGNOSIS — R4189 Other symptoms and signs involving cognitive functions and awareness: Secondary | ICD-10-CM | POA: Diagnosis not present

## 2020-09-20 DIAGNOSIS — R251 Tremor, unspecified: Secondary | ICD-10-CM

## 2020-09-20 NOTE — Progress Notes (Signed)
[facility]    Provider:  Dr Jaynee Eagles Requesting Provider: Imagene Riches, NP Primary Care Provider:  Imagene Riches, NP  CC:  Multiple neurologic symptoms  HPI:  Lynn Lyons is a 40 y.o. female here as requested by Imagene Riches, NP for brain fog, tremors, and multiple other neurologic symptoms in the setting of hyperthyroidism and severe B12 deficiency.  She has recently found TSH issues and word-finding issues for several years. Also recently found B12 99. Ongoing for several years, worsening. She has tremors. She has started falling. Cold flashes across the top of her feet, she will lose her balance, she fell yesterday, she has a lot of trouble staying on track with conversation, she will forget if she is told something 3-5 minutes later, she has tremors (part of the graves disease) that was diagnosed a month ago, she has to write a note for her kids' school, her hand will start writing something that is different than in her brain, she wills tart writing something that has nothing to do, it makes sense and it is a good sentence (not word salad or bad spelling or gibberish), she denies distraction. She constantly has to scribble things out. Trouble following directions, she will start on step one and steo two and she has to reread the instructions because she can;t remember it, when she reads novels she has to reread things, no learning disabilities or ADHD as a child, she skipped the 8th grade, this is a huge change for her, she would remember everything (her husband hated it), she feels she can barely find enough words to talk often. She gets lost in conversation. She loses a thread, becoming a struggle. No hx of dementia in the family. She has been to Rheumatology and dxed with Fibromyalgia. She has ain all over her body, it hurts to even touch her skin, extremely painful, muscle and joint pain, muscles painful she can;t have someone touch her arm, back, thigh, in the back  of the head, sensitivity of the skin and pain of the muscles and joints. She will have numbness in the feet. No other focal neurologic deficits, associated symptoms, inciting events or modifiable factors.  Reviewed notes, labs and imaging from outside physicians, which showed:  MRI lumbar spine is normal, personally reviewed imaging, no reason seen for her imbalance/ataxia  Review of Systems: Patient complains of symptoms per HPI as well as the following symptoms: multiple neurologic symptoms as per above. Pertinent negatives and positives per HPI. All others negative.   Social History   Socioeconomic History  . Marital status: Married    Spouse name: Not on file  . Number of children: Not on file  . Years of education: Not on file  . Highest education level: Not on file  Occupational History  . Not on file  Tobacco Use  . Smoking status: Current Every Day Smoker    Packs/day: 1.00    Types: Cigarettes  . Smokeless tobacco: Never Used  Vaping Use  . Vaping Use: Never used  Substance and Sexual Activity  . Alcohol use: Not Currently  . Drug use: Not Currently  . Sexual activity: Not on file  Other Topics Concern  . Not on file  Social History Narrative  . Not on file   Social Determinants of Health   Financial Resource Strain: Not on file  Food Insecurity: Not on file  Transportation Needs: Not on file  Physical Activity: Not on file  Stress: Not on  file  Social Connections: Not on file  Intimate Partner Violence: Not on file    Family History  Problem Relation Age of Onset  . Cancer Mother        Colorectal  . Crohn's disease Mother   . Hypertension Mother     Past Medical History:  Diagnosis Date  . Fibromyalgia   . Graves disease   . IBS (irritable bowel syndrome)   . Migraines   . Thyroid cyst     Patient Active Problem List   Diagnosis Date Noted  . Fibromyalgia syndrome 09/08/2020  . Patellofemoral pain syndrome of both knees 09/08/2020  . Dry  mouth 09/08/2020  . Graves disease 08/17/2020  . Hyperthyroidism 08/17/2020  . Post viral syndrome 07/03/2019  . Cigarette smoker 06/17/2019  . Chronic migraine with aura 10/15/2018    Past Surgical History:  Procedure Laterality Date  . CHOLECYSTECTOMY  2004  . OOPHORECTOMY    . PARTIAL HYSTERECTOMY    . SKIN CANCER EXCISION  2021  . TUBAL LIGATION      Current Outpatient Medications  Medication Sig Dispense Refill  . cyclobenzaprine (FLEXERIL) 10 MG tablet Take 10 mg by mouth 2 (two) times daily.    Marland Kitchen levocetirizine (XYZAL) 5 MG tablet Take 1 tablet by mouth as needed.    . meloxicam (MOBIC) 15 MG tablet Take 15 mg by mouth daily.    . methimazole (TAPAZOLE) 5 MG tablet Take 0.5 tablets (2.5 mg total) by mouth daily. 45 tablet 1  . oxybutynin (DITROPAN-XL) 10 MG 24 hr tablet Take 10 mg by mouth at bedtime.    . traZODone (DESYREL) 50 MG tablet TAKE 1/2 A TABLET BY MOUTH AT BEDTIME    . venlafaxine XR (EFFEXOR-XR) 37.5 MG 24 hr capsule Take 37.5 mg by mouth at bedtime.     No current facility-administered medications for this visit.    Allergies as of 09/20/2020 - Review Complete 09/20/2020  Allergen Reaction Noted  . Moxifloxacin Shortness Of Breath 07/30/2015  . Amitriptyline Other (See Comments) 05/02/2019  . Gabapentin Other (See Comments) 05/02/2019  . Milnacipran Other (See Comments) 05/02/2019  . Pregabalin  05/02/2019  . Valproic acid Swelling 07/31/2019    Vitals: BP 122/84   Pulse (!) 101   Ht 5\' 3"  (1.6 m)   Wt 164 lb (74.4 kg)   BMI 29.05 kg/m  Last Weight:  Wt Readings from Last 1 Encounters:  09/20/20 164 lb (74.4 kg)   Last Height:   Ht Readings from Last 1 Encounters:  09/20/20 5\' 3"  (1.6 m)     Physical exam: Exam: Gen: NAD, conversant, well nourised, well groomed                     CV: RRR, no MRG. No Carotid Bruits. No peripheral edema, warm, nontender Eyes: Conjunctivae clear without exudates or hemorrhage  Neuro: Detailed  Neurologic Exam  Speech:    Speech is normal; fluent and spontaneous with normal comprehension.  Cognition:    The patient is oriented to person, place, and time;     recent and remote memory intact;     language fluent;     normal attention, concentration,     fund of knowledge Cranial Nerves:    The pupils are equal, round, and reactive to light. The fundi are normal and spontaneous venous pulsations are present. Visual fields are full to finger confrontation. Extraocular movements are intact. Trigeminal sensation is intact and the muscles of mastication  are normal. The face is symmetric. The palate elevates in the midline. Hearing intact. Voice is normal. Shoulder shrug is normal. The tongue has normal motion without fasciculations.   Coordination:     Normal finger to nose   Gait:     Heel-toe and tandem gait with imbalance.   Motor Observation:    No asymmetry, no atrophy, tremulous Tone:    Normal muscle tone.    Posture:    Posture is normal. normal erect    Strength:    Strength is V/V in the upper and lower limbs.      Sensation: intact to LT     Reflex Exam:  DTR's:    Deep tendon reflexes in the upper and lower extremities are brisk bilaterally.   Toes:    The toes are downgoing bilaterally.   Clonus:    Clonus is absent.    Assessment/Plan: 40 y.o. female here as requested by Imagene Riches, NP for brain fog, tremors, and multiple other neurologic symptoms in the setting of hyperthyroidism and severe B12 deficiency.   Severe B12 deficiency need MRi brain and cervical spine to look for demyelinating disease such as subacute combined degeneration due to concerning symptoms of imbalance, falls, numbness, brisk reflexes  If MRI brain and cervical spine look good then I think all her symptoms absolutely could be due to severe B12 deficiency (B12 99) and her newly-diagnosed hyperthyroidism. Both just recently diagnosed and just started treating. I think we should  wait several months maybe even 6 months prior to re-evaluation. I tried to reassure her, I think she is actually doing extremely well given the 2 medical conditions she has been just diagnosed with that can explain all her symptoms.   Orders Placed This Encounter  Procedures  . MR BRAIN W WO CONTRAST  . MR CERVICAL SPINE W WO CONTRAST  . CK   No orders of the defined types were placed in this encounter.   Cc: Imagene Riches, NP,  Imagene Riches, NP  Sarina Ill, MD  [facility] [address] [place], [place] [address]  Phone [phone] Fax [phone]

## 2020-09-20 NOTE — Patient Instructions (Signed)
MRI brain and cervical spine Follow up in 4-6 months and we will make sure you are feeling better

## 2020-09-20 NOTE — Progress Notes (Deleted)
Subjective:    Patient ID: Lynn Lyons is a 40 y.o. female.  HPI {Common ambulatory SmartLinks:19316}  Review of Systems  Neurological:       Hx of Fibromyalgia/Graves disease/ COVID. Pt here to discuss worsening tremors. Pt reports bilateral tremors over the last year, but has progressed over the last 6 months.   Also reports memory fog post covid, reports she had covid back in 2020. She also reports neuropathy in bilateral feet. She reports trouble holding on to items and falling down.     Objective:  Neurological Exam  Physical Exam  Assessment:   ***  Plan:   ***

## 2020-09-21 ENCOUNTER — Telehealth: Payer: Self-pay | Admitting: Neurology

## 2020-09-21 NOTE — Telephone Encounter (Signed)
[employer] pending faxed notes

## 2020-09-22 NOTE — Telephone Encounter (Signed)
no to the covid questions MR Brain w/wo contrast & MR Cervical spine w/wo contrast Dr. Jaynee Eagles [employer] auth: [id number] (exp. 09/21/20 to 12/22/20). Patient is scheduled at [facility] for 09/29/20.

## 2020-09-23 ENCOUNTER — Ambulatory Visit
Admission: RE | Admit: 2020-09-23 | Discharge: 2020-09-23 | Disposition: A | Discharge status: Home / Self Care | Payer: 59 | Source: Ambulatory Visit | Attending: Family | Admitting: Family

## 2020-09-23 ENCOUNTER — Other Ambulatory Visit: Payer: Self-pay

## 2020-09-23 DIAGNOSIS — N63 Unspecified lump in unspecified breast: Secondary | ICD-10-CM

## 2020-09-27 ENCOUNTER — Other Ambulatory Visit: Payer: Self-pay | Admitting: Internal Medicine

## 2020-09-27 DIAGNOSIS — E0500 Thyrotoxicosis with diffuse goiter without thyrotoxic crisis or storm: Secondary | ICD-10-CM

## 2020-09-28 LAB — T4, FREE: Free T4: 0.93 ng/dL (ref 0.82–1.77)

## 2020-09-29 ENCOUNTER — Ambulatory Visit: Payer: 59

## 2020-09-29 ENCOUNTER — Other Ambulatory Visit: Payer: Self-pay

## 2020-09-29 DIAGNOSIS — R4189 Other symptoms and signs involving cognitive functions and awareness: Secondary | ICD-10-CM

## 2020-09-29 DIAGNOSIS — R200 Anesthesia of skin: Secondary | ICD-10-CM

## 2020-09-29 DIAGNOSIS — R270 Ataxia, unspecified: Secondary | ICD-10-CM

## 2020-09-29 DIAGNOSIS — M6281 Muscle weakness (generalized): Secondary | ICD-10-CM

## 2020-09-29 DIAGNOSIS — R251 Tremor, unspecified: Secondary | ICD-10-CM | POA: Diagnosis not present

## 2020-09-29 DIAGNOSIS — R2689 Other abnormalities of gait and mobility: Secondary | ICD-10-CM | POA: Diagnosis not present

## 2020-09-29 DIAGNOSIS — W19XXXA Unspecified fall, initial encounter: Secondary | ICD-10-CM

## 2020-09-29 MED ORDER — GADOBENATE DIMEGLUMINE 529 MG/ML IV SOLN
15.0000 mL | Freq: Once | INTRAVENOUS | Status: AC | PRN
  Administered 2020-09-29: 15 mL via INTRAVENOUS

## 2020-09-29 NOTE — Addendum Note (Signed)
Addended by: Dorita Sciara on: 09/29/2020 12:45 PM   Modules accepted: Orders

## 2020-09-29 NOTE — Progress Notes (Signed)
Ts

## 2020-09-30 ENCOUNTER — Other Ambulatory Visit: Payer: Self-pay

## 2020-10-05 ENCOUNTER — Telehealth: Payer: Self-pay | Admitting: Internal Medicine

## 2020-10-05 NOTE — Telephone Encounter (Signed)
Spoken to patient and notified Dr Shamleffer's comments. Verbalized understanding.   

## 2020-10-05 NOTE — Telephone Encounter (Signed)
Pt misunderstood, I did not ask for kidney pain, needs to check with PCP for  infection. Pt has on AVS what she needs to call us for

## 2020-10-05 NOTE — Telephone Encounter (Signed)
Patient called to advise that she started 2.5 MG Methimazole on 08/17/20. Was advised to call office with any kidney pain.  Patient is having severe left kidney pain - with none of other pain or symptoms associated with kidney stones. Please call to patient at [phone]

## 2020-10-22 LAB — TSH: TSH: 0.956 u[IU]/mL (ref 0.450–4.500)

## 2020-10-22 LAB — SPECIMEN STATUS REPORT

## 2020-11-08 ENCOUNTER — Other Ambulatory Visit: Payer: Self-pay | Admitting: Internal Medicine

## 2020-11-09 LAB — TSH: TSH: 2.89 u[IU]/mL (ref 0.450–4.500)

## 2020-11-11 ENCOUNTER — Encounter: Payer: Self-pay | Admitting: Internal Medicine

## 2020-11-11 ENCOUNTER — Other Ambulatory Visit: Payer: Self-pay

## 2020-11-11 ENCOUNTER — Ambulatory Visit (INDEPENDENT_AMBULATORY_CARE_PROVIDER_SITE_OTHER): Payer: 59 | Admitting: Internal Medicine

## 2020-11-11 VITALS — BP 126/86 | HR 99 | Ht 63.0 in | Wt 162.2 lb

## 2020-11-11 DIAGNOSIS — E0500 Thyrotoxicosis with diffuse goiter without thyrotoxic crisis or storm: Secondary | ICD-10-CM | POA: Diagnosis not present

## 2020-11-11 DIAGNOSIS — E0590 Thyrotoxicosis, unspecified without thyrotoxic crisis or storm: Secondary | ICD-10-CM

## 2020-11-11 NOTE — Progress Notes (Signed)
Name: Lynn Lyons  MRN/ DOB: [id number], 1980/07/02    Age/ Sex: 40 y.o., female     PCP: Imagene Riches, NP   Reason for Endocrinology Evaluation: Hyperthyroidism     Initial Endocrinology Clinic Visit: 08/16/2020    PATIENT IDENTIFIER: Lynn Lyons is a 40 y.o., female with a past medical history of Fibromyalgia, IBS and Hyperthyroidism. She has followed with [facility] since 08/16/2020 for consultative assistance with management of her Hyperthyroidism.   HISTORICAL SUMMARY:   Pt diagnosed with hyperthyroidism with a TSH of 0.34 uIU/Ml with local neck swelling, tenderness and palpitation in 06/2020    She has noted thyromegaly ~ in 2020 , but her uptake and scan was normal at the time.   Has had Upper EGD for dysphagia, S/P stretching Colonoscopy 06/2020  No FH of thyroid disease   On her initial visit to our clinic her TSH was slightly low at 0.311 uIU/mL but we opted to treat due to symptoms of weight loss, palpitations , diarrhea  and tremors.   TRAB upper level of normal at 1.17 IU/L    S/P hystrectomy    SUBJECTIVE:    Today (11/11/2020):  Lynn Lyons is here for a follow up on hyperthyroidism secondary to Graves' disease   Weight has been stable  Is having a headache today  Has worsening diarrhea  Has noted abdominal cramps after eating that she attributes to Methimazole . She is currently takes it at night but despite that has abdominal pain.   Still has tremors but they are better but no resolved as well as numbness of hands and feet .Found to have vitamin B12 deficiency    Denies palpitations   Methimazole 5 mg, HALF a tablet daily      HISTORY:  Past Medical History:  Past Medical History:  Diagnosis Date  . Fibromyalgia   . Graves disease   . IBS (irritable bowel syndrome)   . Migraines   . Thyroid cyst    Past Surgical History:  Past Surgical History:  Procedure Laterality Date  . CHOLECYSTECTOMY  2004   . OOPHORECTOMY    . PARTIAL HYSTERECTOMY    . SKIN CANCER EXCISION  2021  . TUBAL LIGATION      Social History:  reports that she has been smoking cigarettes. She has been smoking about 1.00 pack per day. She has never used smokeless tobacco. She reports previous alcohol use. She reports previous drug use. Family History:  Family History  Problem Relation Age of Onset  . Cancer Mother        Colorectal  . Crohn's disease Mother   . Hypertension Mother      HOME MEDICATIONS: Allergies as of 11/11/2020      Reactions   Moxifloxacin Shortness Of Breath   Amitriptyline Other (See Comments)   "Can't remember"   Gabapentin Other (See Comments)   Swelling   Milnacipran Other (See Comments)   "Can't remember"   Pregabalin    Other reaction(s): Other (See Comments) Swelling   Valproic Acid Swelling      Medication List       Accurate as of Nov 11, 2020  1:53 PM. If you have any questions, ask your nurse or doctor.        cyclobenzaprine 10 MG tablet Commonly known as: FLEXERIL Take 10 mg by mouth 2 (two) times daily.   levocetirizine 5 MG tablet Commonly known as: XYZAL Take 1 tablet by mouth as needed.  meloxicam 15 MG tablet Commonly known as: MOBIC Take 15 mg by mouth daily.   methimazole 5 MG tablet Commonly known as: TAPAZOLE Take 0.5 tablets (2.5 mg total) by mouth daily.   oxybutynin 10 MG 24 hr tablet Commonly known as: DITROPAN-XL Take 10 mg by mouth at bedtime.   traZODone 50 MG tablet Commonly known as: DESYREL TAKE 1/2 A TABLET BY MOUTH AT BEDTIME   venlafaxine XR 37.5 MG 24 hr capsule Commonly known as: EFFEXOR-XR Take 37.5 mg by mouth at bedtime.         OBJECTIVE:   PHYSICAL EXAM: VS: BP 126/86   Pulse 99   Ht 5\' 3"  (1.6 m)   Wt 162 lb 4 oz (73.6 kg)   SpO2 94%   BMI 28.74 kg/m    EXAM: General: Pt appears well and is in NAD  Neck: General: Supple without adenopathy. Thyroid: Thyroid size normal.  No goiter or nodules  appreciated.   Lungs: Clear with good BS bilat with no rales, rhonchi, or wheezes  Heart: Auscultation: RRR.  Abdomen: Normoactive bowel sounds, soft, nontender, without masses or organomegaly palpable  Extremities:  BL LE: No pretibial edema normal ROM and strength.  Mental Status: Judgment, insight: Intact Orientation: Oriented to time, place, and person Mood and affect: No depression, anxiety, or agitation     DATA REVIEWED: 11/08/2020 TSH  2.890 uIU/mL    Results for ALILA, SOTERO (MRN [id number]) as of 11/11/2020 07:29  Ref. Range 08/16/2020 12:04  Thyrotropin Receptor Ab Latest Ref Range: 0.00 - 1.75 IU/L 1.17     Thyroid uptake and scan 08/03/2020  Uniform uptake within enlarged thyroid gland  4-hr I-131 uptake 15.4%  24- hr I- 131 37 %    ASSESSMENT / PLAN / RECOMMENDATIONS:   1. Hyperthyroidism:  - Most likely due to Graves' Disease  -No local neck symptoms -Patient with multiple nonspecific complaints of back pains abdominal pains that she attributes to methimazole.  Patient is on a small dose of methimazole but she states that she is very sensitive to medications -Her TSH is trending up but within normal, she was given the option of stopping methimazole versus slowly tapering the dose and watching how her side effects are -At this point we will reduce the dose of methimazole and she will monitor for side effects - She was given printed [facility] orders for TSH and free T4  Medications  Decrease methimazole 5 mg, half a tablet 3 times a week   Labs in 6 weeks  Follow-up in 3 months Signed electronically by: Mack Guise, MD  [facility]  [facility] [address] [place], [place] [address] Phone: [phone] FAX: [phone]      CC: Imagene Riches, NP [place] [address] Phone: [phone]  Fax: [phone]   Return to Endocrinology clinic as below: Future Appointments  Date  Time Provider [place]  12/02/2020  2:00 PM Warren Danes, [place] [facility]  02/16/2021  8:10 AM Maddax Palinkas, Melanie Crazier, MD [facility] None  02/23/2021  8:30 AM Melvenia Beam, MD [facility] None

## 2020-11-11 NOTE — Patient Instructions (Signed)
-   Decrease Methimazole to Half a tablet three times a week   - Repeat labs in 6 weeks

## 2020-12-02 ENCOUNTER — Other Ambulatory Visit: Payer: Self-pay

## 2020-12-02 ENCOUNTER — Encounter: Payer: Self-pay | Admitting: Physician Assistant

## 2020-12-02 ENCOUNTER — Ambulatory Visit (INDEPENDENT_AMBULATORY_CARE_PROVIDER_SITE_OTHER): Payer: 59 | Admitting: Physician Assistant

## 2020-12-02 DIAGNOSIS — D2371 Other benign neoplasm of skin of right lower limb, including hip: Secondary | ICD-10-CM | POA: Diagnosis not present

## 2020-12-02 DIAGNOSIS — Z86018 Personal history of other benign neoplasm: Secondary | ICD-10-CM

## 2020-12-02 DIAGNOSIS — Z808 Family history of malignant neoplasm of other organs or systems: Secondary | ICD-10-CM

## 2020-12-02 DIAGNOSIS — Z85828 Personal history of other malignant neoplasm of skin: Secondary | ICD-10-CM | POA: Diagnosis not present

## 2020-12-02 DIAGNOSIS — C44311 Basal cell carcinoma of skin of nose: Secondary | ICD-10-CM

## 2020-12-02 DIAGNOSIS — Z1283 Encounter for screening for malignant neoplasm of skin: Secondary | ICD-10-CM | POA: Diagnosis not present

## 2020-12-02 DIAGNOSIS — D485 Neoplasm of uncertain behavior of skin: Secondary | ICD-10-CM

## 2020-12-02 NOTE — Patient Instructions (Signed)

## 2020-12-02 NOTE — Progress Notes (Deleted)
New Patient   Subjective  Lynn Lyons is a 40 y.o. female who presents for the following: New Patient (Initial Visit) (Patient here today to have two lesions check. Per patient she has a history of BCC on her right nasal dorsum. She had a Moh's procedure done with Dr. Rhona Raider. She is not happy with her scar. Patient also has a history of atypical moles. No personal history of melanoma. Check lesion on the left side of her nose x 6 months bleeding, non healing, no pain. Check lesion on right thigh x 9 months bleeding, non healing, no pain. Per patient family history of  non mole skin cancer. No family history of atypical moles or melanoma. ).   The following portions of the chart were reviewed this encounter and updated as appropriate:  Tobacco  Allergies  Meds  Problems  Med Hx  Surg Hx  Fam Hx       Objective  Well appearing patient in no apparent distress; mood and affect are within normal limits.  A full examination was performed including scalp, head, eyes, ears, nose, lips, neck, chest, axillae, abdomen, back, buttocks, bilateral upper extremities, bilateral lower extremities, hands, feet, fingers, toes, fingernails, and toenails. All findings within normal limits unless otherwise noted below.  Right Thigh - Anterior Pink nodule     Left Nasal Sidewall Pink scale with telangectasia     Dorsum of Nose Pink scale      Assessment & Plan  Neoplasm of uncertain behavior of skin (3) Right Thigh - Anterior  Skin / nail biopsy Type of biopsy: tangential   Informed consent: discussed and consent obtained   Timeout: patient name, date of birth, surgical site, and procedure verified   Procedure prep:  Patient was prepped and draped in usual sterile fashion (Non sterile) Prep type:  Chlorhexidine Anesthesia: the lesion was anesthetized in a standard fashion   Anesthetic:  1% lidocaine w/ epinephrine 1-100,000 local infiltration Instrument used: flexible razor  blade   Outcome: patient tolerated procedure well   Post-procedure details: wound care instructions given    Specimen 1 - Surgical pathology Differential Diagnosis: bcc vs scc  Check Margins: No  Left Nasal Sidewall  Skin / nail biopsy Type of biopsy: tangential   Informed consent: discussed and consent obtained   Timeout: patient name, date of birth, surgical site, and procedure verified   Procedure prep:  Patient was prepped and draped in usual sterile fashion (Non sterile) Prep type:  Chlorhexidine Anesthesia: the lesion was anesthetized in a standard fashion   Anesthetic:  1% lidocaine w/ epinephrine 1-100,000 local infiltration Instrument used: flexible razor blade   Outcome: patient tolerated procedure well   Post-procedure details: wound care instructions given    Specimen 2 - Surgical pathology Differential Diagnosis: bcc vs scc  Check Margins: No  Dorsum of Nose  Skin / nail biopsy Type of biopsy: tangential   Informed consent: discussed and consent obtained   Timeout: patient name, date of birth, surgical site, and procedure verified   Procedure prep:  Patient was prepped and draped in usual sterile fashion (Non sterile) Prep type:  Chlorhexidine Anesthesia: the lesion was anesthetized in a standard fashion   Anesthetic:  1% lidocaine w/ epinephrine 1-100,000 local infiltration Instrument used: flexible razor blade   Outcome: patient tolerated procedure well   Post-procedure details: wound care instructions given    Specimen 3 - Surgical pathology Differential Diagnosis: bcc vs scc  Check Margins: No    I, Johnwilliam Shepperson,  PA-C, have reviewed all documentation for this visit. The documentation on 12/02/20 for the exam, diagnosis, procedures, and orders are all accurate and complete.

## 2020-12-07 NOTE — Progress Notes (Signed)
New patient visit   Subjective  Lynn Lyons is a 40 y.o. female who presents for the following: New Patient (Initial Visit) (Patient here today to have two lesions check. Per patient she has a history of BCC on her right nasal dorsum. Patient also has a history of atypical moles. No personal history of melanoma. Check lesion on the left side of her nose x 6 months bleeding, non healing, no pain. Check lesion on right thigh x 9 months bleeding, non healing, no pain. Per patient family history of  non mole skin cancer. No family history of atypical moles or melanoma. ).   The following portions of the chart were reviewed this encounter and updated as appropriate:  Tobacco  Allergies  Meds  Problems  Med Hx  Surg Hx  Fam Hx       Objective  Well appearing patient in no apparent distress; mood and affect are within normal limits.  A full examination was performed including scalp, head, eyes, ears, nose, lips, neck, chest, axillae, abdomen, back, buttocks, bilateral upper extremities, bilateral lower extremities, hands, feet, fingers, toes, fingernails, and toenails. All findings within normal limits unless otherwise noted below.  Right Thigh - Anterior Pink nodule     Left Nasal Sidewall Pink scale with telangectasia     Dorsum of Nose Pink scale      Assessment & Plan  Neoplasm of uncertain behavior of skin (3) Right Thigh - Anterior  Skin / nail biopsy Type of biopsy: tangential   Informed consent: discussed and consent obtained   Timeout: patient name, date of birth, surgical site, and procedure verified   Procedure prep:  Patient was prepped and draped in usual sterile fashion (Non sterile) Prep type:  Chlorhexidine Anesthesia: the lesion was anesthetized in a standard fashion   Anesthetic:  1% lidocaine w/ epinephrine 1-100,000 local infiltration Instrument used: flexible razor blade   Outcome: patient tolerated procedure well   Post-procedure details:  wound care instructions given    Specimen 1 - Surgical pathology Differential Diagnosis: bcc vs scc  Check Margins: No  Left Nasal Sidewall  Skin / nail biopsy Type of biopsy: tangential   Informed consent: discussed and consent obtained   Timeout: patient name, date of birth, surgical site, and procedure verified   Procedure prep:  Patient was prepped and draped in usual sterile fashion (Non sterile) Prep type:  Chlorhexidine Anesthesia: the lesion was anesthetized in a standard fashion   Anesthetic:  1% lidocaine w/ epinephrine 1-100,000 local infiltration Instrument used: flexible razor blade   Outcome: patient tolerated procedure well   Post-procedure details: wound care instructions given    Specimen 2 - Surgical pathology Differential Diagnosis: bcc vs scc  Check Margins: No  Dorsum of Nose  Skin / nail biopsy Type of biopsy: tangential   Informed consent: discussed and consent obtained   Timeout: patient name, date of birth, surgical site, and procedure verified   Procedure prep:  Patient was prepped and draped in usual sterile fashion (Non sterile) Prep type:  Chlorhexidine Anesthesia: the lesion was anesthetized in a standard fashion   Anesthetic:  1% lidocaine w/ epinephrine 1-100,000 local infiltration Instrument used: flexible razor blade   Outcome: patient tolerated procedure well   Post-procedure details: wound care instructions given    Specimen 3 - Surgical pathology Differential Diagnosis: bcc vs scc  Check Margins: No    I, Tim Corriher, PA-C, have reviewed all documentation's for this visit.  The documentation on 12/07/20 for the exam,  diagnosis, procedures and orders are all accurate and complete.

## 2020-12-15 ENCOUNTER — Telehealth: Payer: Self-pay | Admitting: *Deleted

## 2020-12-15 NOTE — Telephone Encounter (Signed)
Phone call to patient to let her know that the skin surgery center does not accept patients health plan. The patient is going to call her health plan and see what mohs surgeons its in network with and I told her we will send to whoever will accept her insurance.

## 2020-12-15 NOTE — Telephone Encounter (Signed)
Path to patient. Mohs referral sent to skin surgery center.

## 2020-12-15 NOTE — Telephone Encounter (Signed)
-----   Message from Warren Danes, [place] sent at 12/14/2020  2:50 PM EDT ----- Mohs for nose ----- Message ----- From: Interface, Lab In [facility] Sent: 12/10/2020   6:12 PM EDT To: Warren Danes, PA-C

## 2021-02-15 ENCOUNTER — Telehealth: Payer: Self-pay | Admitting: Internal Medicine

## 2021-02-15 NOTE — Telephone Encounter (Signed)
Pt calling in had to cancel app for 02/16/2021. Due to husband health conditions,pt is wondering if next app that we can schedule can be over the phone for virtual.

## 2021-02-15 NOTE — Telephone Encounter (Signed)
Please advise 

## 2021-02-16 ENCOUNTER — Ambulatory Visit: Payer: 59 | Admitting: Internal Medicine

## 2021-02-17 NOTE — Telephone Encounter (Signed)
Pt said that she will have to call back to r/s at later time because she doesn't have  anyone to watch her husband to come for an appt. Pt said that she doesn't have anyone to help her at this time.

## 2021-02-20 ENCOUNTER — Other Ambulatory Visit: Payer: Self-pay | Admitting: Internal Medicine

## 2021-02-23 ENCOUNTER — Encounter: Payer: Self-pay | Admitting: Neurology

## 2021-02-23 ENCOUNTER — Telehealth: Payer: Self-pay | Admitting: *Deleted

## 2021-02-23 ENCOUNTER — Telehealth: Payer: Self-pay | Admitting: Neurology

## 2021-02-23 ENCOUNTER — Ambulatory Visit: Payer: 59 | Admitting: Neurology

## 2021-02-23 NOTE — Telephone Encounter (Deleted)
No show x 1 [facility].

## 2021-02-23 NOTE — Telephone Encounter (Signed)
Patient no showed follow-up appointment with Dr. Lavell Anchors today at [facility].  If she calls back for follow-up she can be scheduled with a nurse practitioner please, Amy or Megan.  But also please remind her that we have many patients waiting for appointments, and if she is to no-show or cancel within a short amount of time we do have dismissal policies here in the office.

## 2021-02-23 NOTE — Progress Notes (Deleted)
[facility]    Provider:  Dr Jaynee Eagles Requesting Provider: Imagene Riches, NP Primary Care Provider:  Imagene Riches, NP  CC:  Multiple neurologic symptoms  02/23/2021: 40 y.o. female here as requested by Imagene Riches, NP for brain fog, tremors, and multiple other neurologic symptoms in the setting of hyperthyroidism and severe B12 deficiency. Here for follow up. MRI brain normal (reviewed images), mri cervical spine with mild degenerative changes (reviewed images and agree).   HPI:  Lynn Lyons is a 40 y.o. female here as requested by Imagene Riches, NP for brain fog, tremors, and multiple other neurologic symptoms in the setting of hyperthyroidism and severe B12 deficiency.  She has recently found TSH issues and word-finding issues for several years. Also recently found B12 99. Ongoing for several years, worsening. She has tremors. She has started falling. Cold flashes across the top of her feet, she will lose her balance, she fell yesterday, she has a lot of trouble staying on track with conversation, she will forget if she is told something 3-5 minutes later, she has tremors (part of the graves disease) that was diagnosed a month ago, she has to write a note for her kids' school, her hand will start writing something that is different than in her brain, she wills tart writing something that has nothing to do, it makes sense and it is a good sentence (not word salad or bad spelling or gibberish), she denies distraction. She constantly has to scribble things out. Trouble following directions, she will start on step one and steo two and she has to reread the instructions because she can;t remember it, when she reads novels she has to reread things, no learning disabilities or ADHD as a child, she skipped the 8th grade, this is a huge change for her, she would remember everything (her husband hated it), she feels she can barely find enough words to talk often. She gets lost in  conversation. She loses a thread, becoming a struggle. No hx of dementia in the family. She has been to Rheumatology and dxed with Fibromyalgia. She has ain all over her body, it hurts to even touch her skin, extremely painful, muscle and joint pain, muscles painful she can;t have someone touch her arm, back, thigh, in the back of the head, sensitivity of the skin and pain of the muscles and joints. She will have numbness in the feet. No other focal neurologic deficits, associated symptoms, inciting events or modifiable factors.  Reviewed notes, labs and imaging from outside physicians, which showed:  MRI lumbar spine is normal, personally reviewed imaging, no reason seen for her imbalance/ataxia  Review of Systems: Patient complains of symptoms per HPI as well as the following symptoms: multiple neurologic symptoms as per above. Pertinent negatives and positives per HPI. All others negative.   Social History   Socioeconomic History   Marital status: Married    Spouse name: Not on file   Number of children: Not on file   Years of education: Not on file   Highest education level: Not on file  Occupational History   Not on file  Tobacco Use   Smoking status: Every Day    Packs/day: 1.00    Types: Cigarettes   Smokeless tobacco: Never  Vaping Use   Vaping Use: Never used  Substance and Sexual Activity   Alcohol use: Not Currently   Drug use: Not Currently   Sexual activity: Not on file  Other Topics Concern  Not on file  Social History Narrative   Not on file   Social Determinants of Health   Financial Resource Strain: Not on file  Food Insecurity: Not on file  Transportation Needs: Not on file  Physical Activity: Not on file  Stress: Not on file  Social Connections: Not on file  Intimate Partner Violence: Not on file    Family History  Problem Relation Age of Onset   Cancer Mother        Colorectal   Crohn's disease Mother    Hypertension Mother     Past Medical  History:  Diagnosis Date   Basal cell carcinoma 07/17/2019   Right Nasal [facility])   Fibromyalgia    Graves disease    IBS (irritable bowel syndrome)    Migraines    Thyroid cyst     Patient Active Problem List   Diagnosis Date Noted   Fibromyalgia syndrome 09/08/2020   Patellofemoral pain syndrome of both knees 09/08/2020   Dry mouth 09/08/2020   Graves disease 08/17/2020   Hyperthyroidism 08/17/2020   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     levocetirizine (XYZAL) 5 MG tablet Take 1 tablet by mouth as needed.     meloxicam (MOBIC) 15 MG tablet Take 15 mg by mouth daily.     methimazole (TAPAZOLE) 5 MG tablet TAKE 1/2 TABLET BY MOUTH DAILY 45 tablet 1   oxybutynin (DITROPAN-XL) 10 MG 24 hr tablet Take 10 mg by mouth at bedtime.     rizatriptan (MAXALT) 10 MG tablet Take by mouth.     traZODone (DESYREL) 50 MG tablet TAKE 1/2 A TABLET BY MOUTH AT BEDTIME     venlafaxine XR (EFFEXOR-XR) 75 MG 24 hr capsule Take by mouth daily.     No current facility-administered medications for this visit.    Allergies as of 02/23/2021 - Review Complete 12/02/2020  Allergen Reaction Noted   Moxifloxacin Shortness Of Breath 07/30/2015   Amitriptyline Other (See Comments) 05/02/2019   Gabapentin Other (See Comments) 05/02/2019   Milnacipran Other (See Comments) 05/02/2019   Pregabalin  05/02/2019   Valproic acid Swelling 07/31/2019    Vitals: There were no vitals taken for this visit. Last Weight:  Wt Readings from Last 1 Encounters:  11/11/20 162 lb 4 oz (73.6 kg)   Last Height:    Ht Readings from Last 1 Encounters:  11/11/20 '5\' 3"'$  (1.6 m)     Physical exam: Exam: Gen: NAD, conversant, well nourised, well groomed                     CV: RRR, no MRG. No Carotid Bruits. No peripheral edema, warm, nontender Eyes: Conjunctivae clear without exudates or hemorrhage  Neuro: Detailed Neurologic Exam  Speech:    Speech is normal; fluent and spontaneous with normal comprehension.  Cognition:    The patient is oriented to person, place, and time;     recent and remote memory intact;     language fluent;     normal attention, concentration,  fund of knowledge Cranial Nerves:    The pupils are equal, round, and reactive to light. The fundi are normal and spontaneous venous pulsations are present. Visual fields are full to finger confrontation. Extraocular movements are intact. Trigeminal sensation is intact and the muscles of mastication are normal. The face is symmetric. The palate elevates in the midline. Hearing intact. Voice is normal. Shoulder shrug is normal. The tongue has normal motion without fasciculations.   Coordination:     Normal finger to nose   Gait:     Heel-toe and tandem gait with imbalance.   Motor Observation:    No asymmetry, no atrophy, tremulous Tone:    Normal muscle tone.    Posture:    Posture is normal. normal erect    Strength:    Strength is V/V in the upper and lower limbs.      Sensation: intact to LT     Reflex Exam:  DTR's:    Deep tendon reflexes in the upper and lower extremities are brisk bilaterally.   Toes:    The toes are downgoing bilaterally.   Clonus:    Clonus is absent.    Assessment/Plan: 40 y.o. female here as requested by Imagene Riches, NP for brain fog, tremors, and multiple other neurologic symptoms in the setting of hyperthyroidism and severe B12 deficiency.   Severe B12 deficiency need MRi brain and cervical spine to look for demyelinating disease such as subacute combined degeneration due to  concerning symptoms of imbalance, falls, numbness, brisk reflexes  If MRI brain and cervical spine look good then I think all her symptoms absolutely could be due to severe B12 deficiency (B12 99) and her newly-diagnosed hyperthyroidism. Both just recently diagnosed and just started treating. I think we should wait several months maybe even 6 months prior to re-evaluation. I tried to reassure her, I think she is actually doing extremely well given the 2 medical conditions she has been just diagnosed with that can explain all her symptoms.   No orders of the defined types were placed in this encounter.  No orders of the defined types were placed in this encounter.   Cc: Imagene Riches, NP,  Imagene Riches, NP  Sarina Ill, MD  [facility] [address] [place], [place] [address]  Phone [phone] Fax [phone]

## 2021-03-03 ENCOUNTER — Ambulatory Visit
Payer: 59 | Attending: Student in an Organized Health Care Education/Training Program | Admitting: Student in an Organized Health Care Education/Training Program

## 2021-03-03 ENCOUNTER — Encounter: Payer: Self-pay | Admitting: Student in an Organized Health Care Education/Training Program

## 2021-03-03 ENCOUNTER — Other Ambulatory Visit: Payer: Self-pay

## 2021-03-03 VITALS — BP 139/76 | HR 116 | Temp 98.4°F | Resp 16 | Ht 63.0 in | Wt 150.0 lb

## 2021-03-03 DIAGNOSIS — M549 Dorsalgia, unspecified: Secondary | ICD-10-CM

## 2021-03-03 DIAGNOSIS — M797 Fibromyalgia: Secondary | ICD-10-CM | POA: Diagnosis not present

## 2021-03-03 DIAGNOSIS — G894 Chronic pain syndrome: Secondary | ICD-10-CM

## 2021-03-03 HISTORY — DX: Chronic pain syndrome: G89.4

## 2021-03-03 HISTORY — DX: Dorsalgia, unspecified: M54.9

## 2021-03-03 NOTE — Progress Notes (Signed)
Patient: Lynn Lyons  Service Category: E/M  Provider: Gillis Santa, MD  DOB: 06/02/1981  DOS: 03/03/2021  Referring Provider: Imagene Riches, NP  MRN: [id number]  Setting: Ambulatory outpatient  PCP: Lynn Riches, NP  Type: New Patient  Specialty: Interventional Pain Management    Location: Office  Delivery: Face-to-face     Primary Reason(s) for Visit: Encounter for initial evaluation of one or more chronic problems (new to examiner) potentially causing chronic pain, and posing a threat to normal musculoskeletal function. (Level of risk: High) CC: Pain (fibromyalgia)  HPI  Lynn Lyons is a 40 y.o. year old, female patient, who comes for the first time to our practice referred by Lynn Riches, NP for our initial evaluation of her chronic pain. She has Graves disease; Hyperthyroidism; Post viral syndrome; Cigarette smoker; Chronic migraine with aura; Fibromyalgia; Patellofemoral pain syndrome of both knees; Dry mouth; Musculoskeletal back pain; and Chronic pain syndrome on their problem list. Today she comes in for evaluation of her Pain (fibromyalgia)  Pain Assessment: Location:   Other (Comment) Radiating: n/a Onset: More than a month ago Duration: Chronic pain, Other (Comment) (fibromyalgia) Quality: Pressure, Sharp Severity: 6 /10 (subjective, self-reported pain score)  Effect on ADL: difficulty performing daily activities Timing: Constant Modifying factors: hot showers, muscle relaxers BP: 139/76  HR: (!) 116  Onset and Duration: Gradual and Date of onset: 2009 Cause of pain: Unknown Severity: No change since onset, NAS-11 at its worse: 10/10, NAS-11 at its best: 2/10, NAS-11 now: 6/10, and NAS-11 on the average: 7/10 Timing: Not influenced by the time of the day, During activity or exercise, After activity or exercise, and After a period of immobility Aggravating Factors: Bending, Climbing, Intercourse (sex), Kneeling, Lifiting, Motion, Nerve blocks, Prolonged sitting,  Prolonged standing, Squatting, Stooping , and Walking Alleviating Factors: Stretching, Hot packs, Medications, Resting, and Warm showers or baths Associated Problems: Day-time cramps, Night-time cramps, Dizziness, Fatigue, Inability to concentrate, Nausea, Numbness, Spasms, Sweating, Swelling, Temperature changes, Tingling, Weakness, Pain that wakes patient up, and Pain that does not allow patient to sleep Quality of Pain: Aching, Agonizing, Annoying, Burning, Constant, Intermittent, Cramping, Deep, Disabling, Exhausting, Feeling of constriction, Heavy, Horrible, Hot, Nagging, Sharp, Shooting, Sickening, Splitting, Stabbing, Tender, Throbbing, Tingling, Tiring, and Uncomfortable Previous Examinations or Tests: Biopsy, CT scan, Endoscopy, MRI scan, Nerve block, X-rays, Neurological evaluation, Orthopedic evaluation, and Chiropractic evaluation Previous Treatments: Chiropractic manipulations, Narcotic medications, Physical Therapy, Pool exercises, Stretching exercises, TENS, and Trigger point injections  Lynn Lyons presents today with diffuse musculoskeletal pain related to fibromyalgia.  She states that she was diagnosed with fibromyalgia after an extensive work-up back in 2017.  She states that she has pain all over.  She has tried aquatic therapy in the past which was somewhat helpful.  She states that heat is also helpful as well.  She has tried gabapentin and Lyrica in the past which she states made her swell.  She is on Effexor 150 mg daily for depression as well as chronic pain.  This is managed by her primary care provider.  She is also on Flexeril, meloxicam for chronic pain management.  She has tried tramadol and buprenorphine in the past with limited analgesic response.  She states that oxycodone was helpful in the past.  She states that she is under a a lot of stress.  She states that her pain got worse after her previous childbirth.   Historic Controlled Substance Pharmacotherapy Review    Historical Monitoring: The patient  reports that  she does not currently use drugs. List of all UDS Test(s): No results found for: MDMA, COCAINSCRNUR, [place], [place], CANNABQUANT, THCU, [place] List of other Serum/Urine Drug Screening Test(s):  No results found for: AMPHSCRSER, BARBSCRSER, BENZOSCRSER, COCAINSCRSER, COCAINSCRNUR, PCPSCRSER, PCPQUANT, THCSCRSER, THCU, CANNABQUANT, OPIATESCRSER, OXYSCRSER, PROPOXSCRSER, ETH Historical Background Evaluation: [place] PMP: PDMP reviewed during this encounter. Online review of the past 30-monthperiod conducted.             [place] Department of public safety, offender search: (Editor, commissioningInformation) Non-contributory Risk Assessment Profile: Aberrant behavior: claims that "nothing else works" and extensive time discussing medicaiton Risk factors for fatal opioid overdose: age [id number] old Fatal overdose hazard ratio (HR): Calculation deferred Non-fatal overdose hazard ratio (HR): Calculation deferred Risk of opioid abuse or dependence: 0.7-3.0% with doses ? 36 MME/day and 6.1-26% with doses ? 120 MME/day. Substance use disorder (SUD) risk level: See below Personal History of Substance Abuse (SUD-Substance use disorder):  Alcohol: Negative  Illegal Drugs: Negative  Rx Drugs: Negative  ORT Risk Level calculation: Moderate Risk  Opioid Risk Tool - 03/03/21 0839       Family History of Substance Abuse   Alcohol Positive Female    Illegal Drugs Negative    Rx Drugs Positive Female or Female      Personal History of Substance Abuse   Alcohol Negative    Illegal Drugs Negative    Rx Drugs Negative      Age   Age between 15-45years  Yes      History of Preadolescent Sexual Abuse   History of Preadolescent Sexual Abuse Negative or Female      Psychological Disease   Psychological Disease Negative    Depression Negative      Total Score   Opioid Risk Tool Scoring 6    Opioid Risk Interpretation Moderate Risk            ORT Scoring  interpretation table:  Score <3 = Low Risk for SUD  Score between 4-7 = Moderate Risk for SUD  Score >8 = High Risk for Opioid Abuse   PHQ-2 Depression Scale:  Total score: 0  PHQ-2 Scoring interpretation table: (Score and probability of major depressive disorder)  Score 0 = No depression  Score 1 = 15.4% Probability  Score 2 = 21.1% Probability  Score 3 = 38.4% Probability  Score 4 = 45.5% Probability  Score 5 = 56.4% Probability  Score 6 = 78.6% Probability   PHQ-9 Depression Scale:  Total score: 0  PHQ-9 Scoring interpretation table:  Score 0-4 = No depression  Score 5-9 = Mild depression  Score 10-14 = Moderate depression  Score 15-19 = Moderately severe depression  Score 20-27 = Severe depression (2.4 times higher risk of SUD and 2.89 times higher risk of overuse)   Pharmacologic Plan: Non-opioid analgesic therapy offered.            Initial impression: Poor candidate for opioid analgesics.  Meds   Current Outpatient Medications:    cyanocobalamin (,VITAMIN B-12,) 1000 MCG/ML injection, Inject 1,000 mcg into the muscle every 30 (thirty) days., Disp: , Rfl:    cyclobenzaprine (FLEXERIL) 10 MG tablet, Take 10 mg by mouth 3 (three) times daily., Disp: , Rfl:    HYDROcodone-acetaminophen (NORCO/VICODIN) 5-325 MG tablet, Take 1 tablet by mouth daily., Disp: , Rfl:    levocetirizine (XYZAL) 5 MG tablet, Take 1 tablet by mouth as needed., Disp: , Rfl:    meloxicam (MOBIC) 15 MG tablet, Take 15  mg by mouth daily., Disp: , Rfl:    methimazole (TAPAZOLE) 5 MG tablet, TAKE 1/2 TABLET BY MOUTH DAILY (Patient taking differently: 3 (three) times a week.), Disp: 45 tablet, Rfl: 1   oxybutynin (DITROPAN-XL) 10 MG 24 hr tablet, Take 10 mg by mouth at bedtime., Disp: , Rfl:    rizatriptan (MAXALT) 10 MG tablet, Take by mouth., Disp: , Rfl:    traZODone (DESYREL) 50 MG tablet, 50 mg. 1 tablet at bedtime, Disp: , Rfl:    venlafaxine XR (EFFEXOR-XR) 75 MG 24 hr capsule, Take by mouth daily.,  Disp: , Rfl:   Imaging Review    Narrative [facility] [address], [place], [place] [address] [phone]  NEUROIMAGING REPORT   STUDY DATE: 09/29/2020 PATIENT NAME: Alaysha Jefcoat DOB: [birth year] MRN: [id number]  EXAM: MRI of the cervical spine with and without contrast  ORDERING CLINICIAN: Sarina Ill, MD CLINICAL HISTORY: 40 year old woman with numbness and ataxia COMPARISON FILMS: None  TECHNIQUE: MRI of the cervical spine was obtained utilizing 3 mm sagittal slices from the posterior fossa down to the T3-4 level with T1, T2 and inversion recovery views. In addition 4 mm axial slices from D5-3 down to T1-2 level were included with T2 and gradient echo views. CONTRAST: 15 mL MultiHance IMAGING SITE: [facility], [address], [place], [place] [address]  FINDINGS: :  On sagittal images, the spine is imaged from above the cervicomedullary junction to T2-T3.   The spinal cord is of normal caliber and signal.   The vertebral bodies are normally aligned.   The vertebral bodies have normal signal.  Incidental note is made of a small hemangioma in the T1 vertebral body towards the left.  The discs and interspaces were further evaluated on axial views from C2 to T1 as follows: C2 - C3:  The disc and interspace appear normal. C3 - C4:  The disc and interspace appear normal. C4 - C5: There is mild disc bulging.  No spondylosis.  There is no foraminal narrowing, spinal stenosis or nerve root compression. C5 - C6:  There is mild disc bulging.  No spondylosis.  There is no foraminal narrowing, spinal stenosis or nerve root compression. C6 - C7:  There is minimal disc bulging.  No spondylosis.  There is no foraminal narrowing, spinal stenosis or nerve root compression.. C7 - T1:  The disc and interspace appear normal.  After the infusion of contrast, normal enhancement pattern is observed.  Impression This MRI of the cervical spine with  and without contrast shows the following: 1.   Spinal cord appears normal, before and after contrast 2.   Mild disc degenerative changes at C4-C5, C5-C6 and C6-C7 that does not lead to nerve root compression or spinal stenosis. 3.   Normal enhancement pattern.    INTERPRETING PHYSICIAN: Richard A. Felecia Shelling, MD, PhD, FAAN Certified in  Neuroimaging by [employer] MR LUMBAR SPINE WO CONTRAST  Narrative CLINICAL DATA:  Low back pain over the last 6 months. Incontinence. Bilateral leg numbness.  EXAM: MRI LUMBAR SPINE WITHOUT CONTRAST  TECHNIQUE: Multiplanar, multisequence MR imaging of the lumbar spine was performed. No intravenous contrast was administered.  COMPARISON:  Radiography 01/02/2018  FINDINGS: Segmentation:  5 lumbar type vertebral bodies.  Alignment:  Normal  Vertebrae:  Normal  Conus medullaris and cauda equina: Conus extends to the L1-2 level. Conus and cauda equina appear normal.  Paraspinal and other soft tissues: Normal  Disc levels:  Intervertebral disc levels  are normal. No disc degeneration, bulge or herniation. No stenosis of the canal or foramina. No facet arthropathy. Nerve root distribution within the thecal sac within normal limits.  IMPRESSION: Normal examination.   Electronically Signed By: Nelson Chimes M.D. On: 09/01/2020 08:34  Complexity Note: Imaging results reviewed. Results shared with Lynn Lyons, using Layman's terms.                         ROS  Cardiovascular: No reported cardiovascular signs or symptoms such as High blood pressure, coronary artery disease, abnormal heart rate or rhythm, heart attack, blood thinner therapy or heart weakness and/or failure Pulmonary or Respiratory: Shortness of breath, Smoking, and Snoring  Neurological: Incontinence:  Urinary Psychological-Psychiatric: Difficulty sleeping and or falling asleep Gastrointestinal: Alternating episodes iof diarrhea and constipation  (IBS-Irritable bowe syndrome) Genitourinary: Passing kidney stones, Peeing blood, and Recurrent Urinary Tract infections Hematological: Weakness due to low blood hemoglobin or red blood cell count (Anemia) and Brusing easily Endocrine: Slow thyroid Rheumatologic: Generalized muscle aches (Fibromyalgia) Musculoskeletal: Negative for myasthenia gravis, muscular dystrophy, multiple sclerosis or malignant hyperthermia Work History: Quit going to work on his/her own  Allergies  Lynn Lyons is allergic to moxifloxacin, amitriptyline, gabapentin, milnacipran, pregabalin, valproic acid, and wellbutrin [bupropion].  Laboratory Chemistry Profile   Renal Lab Results  Component Value Date   BUN <5 (L) 01/04/2019   CREATININE 0.93 01/04/2019   GFRAA >60 01/04/2019   GFRNONAA >60 01/04/2019     Electrolytes Lab Results  Component Value Date   NA 137 01/04/2019   K 3.9 01/04/2019   CL 102 01/04/2019   CALCIUM 9.0 01/04/2019     Hepatic Lab Results  Component Value Date   AST 17 01/04/2019   ALT 14 01/04/2019   ALBUMIN 3.8 01/04/2019   ALKPHOS 104 01/04/2019   LIPASE 25 01/04/2019     ID Lab Results  Component Value Date   SARSCOV2NAA NOT DETECTED 01/05/2019     Bone No results found for: VD25OH, VD125OH2TOT, FT7322GU5, KY7062BJ6, 25OHVITD1, 25OHVITD2, 25OHVITD3, TESTOFREE, TESTOSTERONE   Endocrine Lab Results  Component Value Date   GLUCOSE 64 (L) 01/04/2019   TSH 2.890 11/08/2020   FREET4 0.93 09/27/2020     Neuropathy No results found for: VITAMINB12, FOLATE, HGBA1C, HIV   CNS No results found for: COLORCSF, APPEARCSF, RBCCOUNTCSF, WBCCSF, POLYSCSF, LYMPHSCSF, EOSCSF, PROTEINCSF, GLUCCSF, JCVIRUS, CSFOLI, IGGCSF, LABACHR, ACETBL, LABACHR, ACETBL   Inflammation (CRP: Acute  ESR: Chronic) No results found for: CRP, ESRSEDRATE, LATICACIDVEN   Rheumatology No results found for: RF, ANA, LABURIC, URICUR, LYMEIGGIGMAB, LYMEABIGMQN, HLAB27   Coagulation Lab Results   Component Value Date   PLT 365 01/04/2019   DDIMER 0.48 01/04/2019     Cardiovascular Lab Results  Component Value Date   HGB 13.6 01/04/2019   HCT 39.5 01/04/2019     Screening Lab Results  Component Value Date   SARSCOV2NAA NOT DETECTED 01/05/2019   COVIDSOURCE NASOPHARYNGEAL 01/05/2019     Cancer No results found for: CEA, CA125, LABCA2   Allergens No results found for: ALMOND, APPLE, ASPARAGUS, AVOCADO, BANANA, BARLEY, BASIL, BAYLEAF, GREENBEAN, LIMABEAN, WHITEBEAN, BEEFIGE, REDBEET, BLUEBERRY, BROCCOLI, CABBAGE, MELON, CARROT, CASEIN, CASHEWNUT, CAULIFLOWER, CELERY     Note: Lab results reviewed.  [place]  Drug: Lynn Lyons  reports that she does not currently use drugs. Alcohol:  reports that she does not currently use alcohol. Tobacco:  reports that she has been smoking cigarettes. She has been smoking an average of 1  pack per day. She has never used smokeless tobacco. Medical:  has a past medical history of Basal cell carcinoma (07/17/2019), Fibromyalgia, Graves disease, IBS (irritable bowel syndrome), Migraines, and Thyroid cyst. Family: family history includes Cancer in her mother; Crohn's disease in her mother; Hypertension in her mother.  Past Surgical History:  Procedure Laterality Date   ABDOMINAL HYSTERECTOMY     CHOLECYSTECTOMY  2004   OOPHORECTOMY     PARTIAL HYSTERECTOMY     SKIN CANCER EXCISION  2021   TUBAL LIGATION     Active Ambulatory Problems    Diagnosis Date Noted   Graves disease 08/17/2020   Hyperthyroidism 08/17/2020   Post viral syndrome 07/03/2019   Cigarette smoker 06/17/2019   Chronic migraine with aura 10/15/2018   Fibromyalgia 09/08/2020   Patellofemoral pain syndrome of both knees 09/08/2020   Dry mouth 09/08/2020   Musculoskeletal back pain 03/03/2021   Chronic pain syndrome 03/03/2021   Resolved Ambulatory Problems    Diagnosis Date Noted   No Resolved Ambulatory Problems   Past Medical History:  Diagnosis Date   Basal  cell carcinoma 07/17/2019   IBS (irritable bowel syndrome)    Migraines    Thyroid cyst    Constitutional Exam  General appearance: Well nourished, well developed, and well hydrated. In no apparent acute distress Vitals:   03/03/21 0828  BP: 139/76  Pulse: (!) 116  Resp: 16  Temp: 98.4 F (36.9 C)  TempSrc: Temporal  SpO2: 100%  Weight: 150 lb (68 kg)  Height: _0  (1.6 m)   BMI Assessment: Estimated body mass index is 26.57 kg/m as calculated from the following:   Height as of this encounter: _1  (1.6 m).   Weight as of this encounter: 150 lb (68 kg).  BMI interpretation table: BMI level Category Range association with higher incidence of chronic pain  <18 kg/m2 Underweight   18.5-24.9 kg/m2 Ideal body weight   25-29.9 kg/m2 Overweight Increased incidence by 20%  30-34.9 kg/m2 Obese (Class I) Increased incidence by 68%  35-39.9 kg/m2 Severe obesity (Class II) Increased incidence by 136%  >40 kg/m2 Extreme obesity (Class III) Increased incidence by 254%   Patient's current BMI Ideal Body weight  Body mass index is 26.57 kg/m. Ideal body weight: 52.4 kg (115 lb 8.3 oz) Adjusted ideal body weight: 58.7 kg (129 lb 5 oz)   BMI Readings from Last 4 Encounters:  03/03/21 26.57 kg/m  11/11/20 28.74 kg/m  09/20/20 29.05 kg/m  09/08/20 28.91 kg/m   Wt Readings from Last 4 Encounters:  03/03/21 150 lb (68 kg)  11/11/20 162 lb 4 oz (73.6 kg)  09/20/20 164 lb (74.4 kg)  09/08/20 163 lb 3.2 oz (74 kg)    Psych/Mental status: Alert, oriented x 3 (person, place, & time)       Eyes: PERLA Respiratory: No evidence of acute respiratory distress Diffuse MSK pain  5 out of 5 strength bilateral upper extremity: Shoulder abduction, elbow flexion, elbow extension, thumb extension. 5 out of 5 strength bilateral lower extremity: Plantar flexion, dorsiflexion, knee flexion, knee extension. Normal gait  Assessment  Primary Diagnosis & Pertinent Problem List: The primary  encounter diagnosis was Fibromyalgia. Diagnoses of Musculoskeletal back pain and Chronic pain syndrome were also pertinent to this visit.  Visit Diagnosis (New problems to examiner): 1. Fibromyalgia   2. Musculoskeletal back pain   3. Chronic pain syndrome    Plan of Care    I had an extensive discussion with the patient regarding  treatment plan.  Unfortunately she has failed many evidence-based therapies for fibromyalgia including Lyrica and gabapentin for which she had an adverse reaction to.  She continues on Effexor which is reasonable.  She also utilizes Flexeril for muscle spasms and meloxicam 15 mg daily for musculoskeletal pain which is also reasonable.  I do not recommend chronic opioid therapy for her condition.  I counseled her on the risks of chronic opioid therapy and fibromyalgia patients especially at 2 and 5 years and the data indicates an amplification of pain, decreased functional status and worsening depression.  She has tried tramadol and buprenorphine with limited response.  We did discuss a lidocaine infusion for fibromyalgia.  Patient states that she has tachyarrhythmia post-COVID with concomitant shortness of breath.  For this reason, discussed deferring a lidocaine infusion until her tachycardia symptoms post COVID resolved.  Patient states that oxycodone was helpful.  I strongly advised against short acting opioid analgesics for fibromyalgia given evidence that it can amplify fibromyalgia related pain long-term.  Patient endorsed understanding.  Encouraged her to continue with physical therapy and her current medications.  Provider-requested follow-up: No follow-ups on file.  No future appointments.  I spent a total of 60 minutes reviewing chart data, face-to-face evaluation with the patient, counseling and coordination of care as detailed above.   Note by: Lynn Santa, MD Date: 03/03/2021; Time: 9:37 AM

## 2021-05-02 ENCOUNTER — Encounter: Payer: Self-pay | Admitting: Cardiology

## 2021-05-02 ENCOUNTER — Encounter: Payer: Self-pay | Admitting: *Deleted

## 2021-05-05 ENCOUNTER — Encounter: Payer: Self-pay | Admitting: Cardiology

## 2021-05-10 DIAGNOSIS — J309 Allergic rhinitis, unspecified: Secondary | ICD-10-CM | POA: Insufficient documentation

## 2021-05-10 DIAGNOSIS — F329 Major depressive disorder, single episode, unspecified: Secondary | ICD-10-CM | POA: Insufficient documentation

## 2021-05-10 DIAGNOSIS — E041 Nontoxic single thyroid nodule: Secondary | ICD-10-CM | POA: Insufficient documentation

## 2021-05-10 DIAGNOSIS — N3942 Incontinence without sensory awareness: Secondary | ICD-10-CM | POA: Insufficient documentation

## 2021-05-10 DIAGNOSIS — K589 Irritable bowel syndrome without diarrhea: Secondary | ICD-10-CM | POA: Insufficient documentation

## 2021-05-10 NOTE — Progress Notes (Signed)
Cardiology Office Note:    Date:  05/11/2021   ID:  Lynn Lyons, DOB 1980/10/12, MRN [id number]  PCP:  Imagene Riches, NP  Cardiologist:  Shirlee More, MD   Referring MD: Imagene Riches, NP  ASSESSMENT:    1. Palpitations   2. Tachycardia    PLAN:    In order of problems listed above:  Multiple potential mechanisms including post COVID autonomic dysfunction as well as unfortunately tachycardia induced by medications including cyclobenzaprine and Effexor.  She tells me in the past she took metoprolol with good response tolerated well and after wearing an event monitor for 1 week and then place her back on sustained-release beta-blocker also helpful for migraine I asked her to decrease her cyclobenzaprine to twice daily and asked her to consider withdrawing from infection which she tells me that is really necessary for management of her migraine anxiety and chronic pain. I was unaware that there was an issue receiving lidocaine infusion for chronic pain and that she was told to see me for clearance I do not see any problem with that treatment cardiovascular perspective she has no documented heart disease or cardiac arrhythmia other than sinus tachycardia.  Next appointment 6 weeks   Medication Adjustments/Labs and Tests Ordered: Current medicines are reviewed at length with the patient today.  Concerns regarding medicines are outlined above.  Orders Placed This Encounter  Procedures   LONG TERM MONITOR (3-14 DAYS)   EKG 12-Lead   Meds ordered this encounter  Medications   metoprolol succinate (TOPROL XL) 25 MG 24 hr tablet    Sig: Take 1 tablet (25 mg total) by mouth daily.    Dispense:  90 tablet    Refill:  1   Chief complaint palpitation rapid heart rate  History of Present Illness:    Lynn Lyons is a 40 y.o. female who is being seen today for the evaluation of palpitation with rapid heartbeat at the request of York, Regina F, NP. Is a very complex visit with a  great deal of information and multiple interactions with different physicians as detailed below. Her medical problems include migraine and chronic pain syndrome and she takes Flexeril cyclobenzaprine as well as venlafaxine both of which can cause sinus  tachycardia She shows me a note from her cardiologist [facility] telling her that her average heart rate is less than 100 bpm and there is no arrhythmia when she used an event monitor November 2020. She had an EKG performed 01/06/2019 showing sinus tachycardia 113 bpm otherwise normal. Her last T4 and TSH were both normal. When seen by his PCP 03/24/2021 labs ordered including CBC CMP TSH results unavailable 12/03/2019 hemoglobin 14.1 potassium 4.5 GFR 78 cc She had a chest CTA pulmonary embolism protocol 12/03/2019 which was normal. She had a cardiac echo performed [facility] 05/16/2019 showed normal left ventricular size wall thickness systolic function EF 55 to 60% normal right ventricular size and function and no valvular abnormality. She utilized a event monitor November 2020 results unavailable reports She was seen [facility] cardiology Dr. Lamonte Sakai May 09, 2019 for several complaints there is a note that she had COVID-19 pneumonia in June 2020 and subsequent palpitation and shortness of breath her EKG was described as sinus tachycardia otherwise normal.  He ordered a BNP level which was very low at 12.6 and an event monitor as well as the echocardiogram she was not seen in follow-up. She was seen by pulmonary [facility] notation  she had pulmonary function test performed showing minimal obstruction 6-minute walk test did not show desaturation and notation that she was felt to have symptoms post viral syndrome/COVID.  She is troubled by sensation of her heart racing after COVID-19 unvaccinated from her description she was quite ill and she deferred hospitalization after ED visit At times with activity her heart  rates can be greater than 120 bpm up to 140 and she is very aware of her heart beating She tells me when she wore the monitor in November 2020 she had little or no symptoms She has no background history of heart disease congenital rheumatic is not having angina edema or syncope. Past Medical History:  Diagnosis Date   Allergic rhinitis    Basal cell carcinoma 07/17/2019   Right Nasal Dorsum [facility]) [facility])   Chronic migraine with aura 10/15/2018   Chronic pain syndrome 03/03/2021   Cigarette smoker 06/17/2019   Dry mouth 09/08/2020   Fibromyalgia    Graves disease    Hyperthyroidism 08/17/2020   IBS (irritable bowel syndrome)    Incontinence without sensory awareness    Major depressive disorder with single episode    Migraines    Musculoskeletal back pain 03/03/2021   Patellofemoral pain syndrome of both knees 09/08/2020   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     Allergies:   Moxifloxacin, Amitriptyline, Depakote [divalproex sodium], Gabapentin, Milnacipran, Pregabalin, Valproic acid, and Wellbutrin [bupropion]   Social History   Socioeconomic History   Marital status: Married    Spouse name: Not on file   Number of children: Not on file   Years of education: Not on file   Highest education level: Not on file  Occupational History   Not on file  Tobacco Use   Smoking status: Every Day    Packs/day: 0.50    Types: Cigarettes   Smokeless tobacco: Never  Vaping Use   Vaping Use: Never used  Substance and Sexual Activity   Alcohol use:  Not Currently   Drug use: Not Currently   Sexual activity: Not on file  Other Topics Concern   Not on file  Social History Narrative   Not on file   Social Determinants of Health   Financial Resource Strain: Not on file  Food Insecurity: Not on file  Transportation Needs: Not on file  Physical Activity: Not on file  Stress: Not on file  Social Connections: Not on file     Family History: The patient's family history includes Cancer in her mother; Crohn's disease in her mother; Diverticulitis in her father; Hypertension in her mother.  ROS:   ROS Please see the history of present illness.     All other systems reviewed and are negative.  EKGs/Labs/Other Studies Reviewed:    The following studies were reviewed today:   EKG:  EKG is  ordered today.  The ekg ordered today is personally reviewed and demonstrates sinus rhythm 98 bpm  otherwise normal  Recent Labs: 11/08/2020: TSH 2.890    Physical Exam:    VS:  BP 110/74 (BP Location: Left Arm, Patient Position: Sitting, Cuff Size: Normal)   Pulse 98   Ht 5\' 4"  (1.626 m)   Wt 168 lb (76.2 kg)   SpO2 99%   BMI 28.84 kg/m     Wt Readings from Last 3 Encounters:  05/11/21 168 lb (76.2 kg)  03/03/21 150 lb (68 kg)  11/11/20 162 lb 4 oz (73.6 kg)     GEN: She does not look chronically ill well nourished, well developed in no acute distress HEENT: Normal NECK: No JVD; No carotid bruits LYMPHATICS: No lymphadenopathy CARDIAC: RRR, no murmurs, rubs, gallops RESPIRATORY:  Clear to auscultation without rales, wheezing or rhonchi  ABDOMEN: Soft, non-tender, non-distended MUSCULOSKELETAL:  No edema; No deformity  SKIN: Warm and dry NEUROLOGIC:  Alert and oriented x 3 PSYCHIATRIC:  Normal affect     Signed, Shirlee More, MD  05/11/2021 10:03 AM    [place]

## 2021-05-11 ENCOUNTER — Ambulatory Visit (INDEPENDENT_AMBULATORY_CARE_PROVIDER_SITE_OTHER): Payer: 59 | Admitting: Cardiology

## 2021-05-11 ENCOUNTER — Other Ambulatory Visit: Payer: Self-pay

## 2021-05-11 ENCOUNTER — Ambulatory Visit: Payer: 59

## 2021-05-11 ENCOUNTER — Encounter: Payer: Self-pay | Admitting: Cardiology

## 2021-05-11 VITALS — BP 110/74 | HR 98 | Ht 64.0 in | Wt 168.0 lb

## 2021-05-11 DIAGNOSIS — R000 Tachycardia, unspecified: Secondary | ICD-10-CM

## 2021-05-11 DIAGNOSIS — R002 Palpitations: Secondary | ICD-10-CM

## 2021-05-11 MED ORDER — METOPROLOL SUCCINATE ER 25 MG PO TB24: 25.0000 mg | ORAL_TABLET | Freq: Every day | ORAL | 1 refills | Status: DC

## 2021-05-11 NOTE — Patient Instructions (Signed)
Medication Instructions:  Your physician has recommended you make the following change in your medication:  START TOPROL XL 25MG  ONCE DAILY AFTER FINISHED WITH HEART MONITOR DECREASE FLEXERIL TO TWO TIMES DAILY  *If you need a refill on your cardiac medications before your next appointment, please call your pharmacy*   Lab Work: NONE If you have labs (blood work) drawn today and your tests are completely normal, you will receive your results only by: [place] (if you have MyChart) OR A paper copy in the mail If you have any lab test that is abnormal or we need to change your treatment, we will call you to review the results.   Testing/Procedures: ZIO PATCH PLACED TODAY TO BE WORN FOR 7 DAYS AND THEN MAILED TO COMPANY    Follow-Up: At [facility], you and your health needs are our priority.  As part of our continuing mission to provide you with exceptional heart care, we have created designated Provider Care Teams.  These Care Teams include your primary Cardiologist (physician) and Advanced Practice Providers (APPs -  Physician Assistants and Nurse Practitioners) who all work together to provide you with the care you need, when you need it.  We recommend signing up for the patient portal called "MyChart".  Sign up information is provided on this After Visit Summary.  MyChart is used to connect with patients for Virtual Visits (Telemedicine).  Patients are able to view lab/test results, encounter notes, upcoming appointments, etc.  Non-urgent messages can be sent to your provider as well.   To learn more about what you can do with MyChart, go to [email or web address].    Your next appointment:   6 week(s)  The format for your next appointment:   In Person  Provider:   Shirlee More, MD     Other Instructions

## 2021-07-03 NOTE — Progress Notes (Deleted)
Cardiology Office Note:    Date:  07/04/2021   ID:  [name], DOB 17-Sep-1980, MRN [id number]  PCP:  Imagene Riches, NP  Cardiologist:  Shirlee More, MD    Referring MD: Imagene Riches, NP    ASSESSMENT:    1. Palpitations   2. Tachycardia    PLAN:    In order of problems listed above:  ***   Next appointment: ***   Medication Adjustments/Labs and Tests Ordered: Current medicines are reviewed at length with the patient today.  Concerns regarding medicines are outlined above.  No orders of the defined types were placed in this encounter.  No orders of the defined types were placed in this encounter.   No chief complaint on file.   History of Present Illness:    Lynn Lyons is a 41 y.o. female with a hx of migraine and chronic pain syndrome last seen 05/11/2021 for palpitation with rapid heart rhythm, COVID-19 infection.She had a chest CTA pulmonary embolism protocol 12/03/2019 which was normal. She had a cardiac echo performed [facility] 05/16/2019 showed normal left ventricular size wall thickness systolic function EF 55 to 60% normal right ventricular size and function and no valvular abnormality. She utilized a event monitor November 2020 results unavailable reports She was seen [facility] cardiology Dr. Lamonte Sakai May 09, 2019 for several complaints there is a note that she had COVID-19 pneumonia in June 2020 and subsequent palpitation and shortness of breath her EKG was described as sinus tachycardia otherwise normal.  He ordered a BNP level which was very low at 12.6 and an event monitor as well as the echocardiogram she was not seen in follow-up. She was seen by pulmonary [facility] notation she had pulmonary function test performed showing minimal obstruction 6-minute walk test did not show desaturation and notation that she was felt to have symptoms post viral syndrome/COVID.   Compliance with diet, lifestyle and medications:  *** Past Medical History:  Diagnosis Date   Allergic rhinitis    Basal cell carcinoma 07/17/2019   Right Nasal Dorsum [facility]) [facility])   Chronic migraine with aura 10/15/2018   Chronic pain syndrome 03/03/2021   Cigarette smoker 06/17/2019   Dry mouth 09/08/2020   Fibromyalgia    Graves disease    Hyperthyroidism 08/17/2020   IBS (irritable bowel syndrome)    Incontinence without sensory awareness    Major depressive disorder with single episode    Migraines    Musculoskeletal back pain 03/03/2021   Patellofemoral pain syndrome of both knees 09/08/2020   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     Allergies:   Moxifloxacin, Amitriptyline, Depakote [divalproex sodium], Gabapentin, Milnacipran, Pregabalin, Valproic acid, and Wellbutrin [bupropion]   Social History   Socioeconomic History   Marital status: Married    Spouse name: Not on file   Number of children: Not on file   Years of education: Not on file   Highest education level: Not on file  Occupational History   Not on file  Tobacco Use   Smoking status: Every Day    Packs/day: 0.50    Types: Cigarettes   Smokeless tobacco: Never  Vaping Use   Vaping Use: Never used  Substance and Sexual Activity   Alcohol use: Not Currently   Drug use: Not Currently   Sexual activity: Not on file  Other Topics Concern   Not on file  Social History Narrative   Not on file   Social Determinants of Health   Financial Resource Strain: Not on file  Food Insecurity: Not on file  Transportation Needs: Not  on file  Physical Activity: Not on file  Stress: Not on file  Social Connections: Not on file     Family History: The patient's ***family history includes Cancer in her mother; Crohn's disease in her mother; Diverticulitis in her father; Hypertension in her mother. ROS:   Please see the history of present illness.    All other systems reviewed and are negative.  EKGs/Labs/Other Studies Reviewed:    The following studies were reviewed today:  EKG:  EKG ordered today and personally reviewed.  The ekg ordered today demonstrates ***  Recent Labs: 11/08/2020: TSH 2.890  Recent Lipid Panel No results found for: CHOL, TRIG, HDL, CHOLHDL, VLDL, LDLCALC, LDLDIRECT  Physical Exam:    VS:  There were no vitals taken for this visit.    Wt Readings from Last 3 Encounters:  05/11/21 168 lb (76.2 kg)  03/03/21 150 lb (68 kg)  11/11/20 162 lb 4 oz (73.6 kg)     GEN: *** Well nourished, well developed in no acute distress HEENT: Normal NECK: No JVD; No carotid bruits LYMPHATICS: No lymphadenopathy CARDIAC: ***RRR, no murmurs, rubs, gallops RESPIRATORY:  Clear to auscultation without rales, wheezing or rhonchi  ABDOMEN: Soft, non-tender, non-distended MUSCULOSKELETAL:  No edema; No deformity  SKIN: Warm and dry NEUROLOGIC:  Alert and oriented x 3 PSYCHIATRIC:  Normal affect    Signed, Shirlee More, MD  07/04/2021 7:25 AM    [facility]

## 2021-07-04 ENCOUNTER — Ambulatory Visit: Payer: 59 | Admitting: Cardiology

## 2021-07-04 ENCOUNTER — Telehealth: Payer: Self-pay | Admitting: *Deleted

## 2021-07-04 NOTE — Telephone Encounter (Signed)
Reached out to pt today about heart monitor. She stated the monitor had given her an itchy rash and sent it back after 2 days; zio site states monitor is lost. We rescheduled pt because of no monitor and also she is sick and unable to come anyway. Reached out to [facility] office to request that they put monitor on pt and send [place] results since they have one that doesn't irritate pt's skin. Will fax order to [place] and they will call pt to come in and have it applied.

## 2021-08-11 ENCOUNTER — Other Ambulatory Visit: Payer: Self-pay | Admitting: Family

## 2021-08-17 ENCOUNTER — Other Ambulatory Visit: Payer: Self-pay | Admitting: Family

## 2021-08-17 DIAGNOSIS — N6325 Unspecified lump in the left breast, overlapping quadrants: Secondary | ICD-10-CM

## 2021-08-22 NOTE — Progress Notes (Unsigned)
Cardiology Office Note:    Date:  08/22/2021   ID:  Lynn Lyons, DOB Mar 29, 1981, MRN [id number]  PCP:  Imagene Riches, NP  Cardiologist:  Shirlee More, MD    Referring MD: Imagene Riches, NP    ASSESSMENT:    No diagnosis found. PLAN:    In order of problems listed above:  ***   Next appointment: ***   Medication Adjustments/Labs and Tests Ordered: Current medicines are reviewed at length with the patient today.  Concerns regarding medicines are outlined above.  No orders of the defined types were placed in this encounter.  No orders of the defined types were placed in this encounter.   No chief complaint on file.   History of Present Illness:    Lynn Lyons is a 41 y.o. female with a hx of *** last seen ***. Compliance with diet, lifestyle and medications: *** Past Medical History:  Diagnosis Date   Allergic rhinitis    Basal cell carcinoma 07/17/2019   Right Nasal Dorsum [facility]) [facility])   Chronic migraine with aura 10/15/2018   Chronic pain syndrome 03/03/2021   Cigarette smoker 06/17/2019   Dry mouth 09/08/2020   Fibromyalgia    Graves disease    Hyperthyroidism 08/17/2020   IBS (irritable bowel syndrome)    Incontinence without sensory awareness    Major depressive disorder with single episode    Migraines    Musculoskeletal back pain 03/03/2021   Patellofemoral pain syndrome of both knees 09/08/2020   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     Allergies:   Moxifloxacin, Amitriptyline, Depakote [divalproex sodium],  Gabapentin, Milnacipran, Pregabalin, Valproic acid, and Wellbutrin [bupropion]   Social History   Socioeconomic History   Marital status: Married    Spouse name: Not on file   Number of children: Not on file   Years of education: Not on file   Highest education level: Not on file  Occupational History   Not on file  Tobacco Use   Smoking status: Every Day    Packs/day: 0.50    Types: Cigarettes   Smokeless tobacco: Never  Vaping Use   Vaping Use: Never used  Substance and Sexual Activity   Alcohol use: Not Currently   Drug use: Not Currently   Sexual activity: Not on file  Other Topics Concern   Not on file  Social History Narrative   Not on file   Social Determinants of Health   Financial Resource Strain: Not on file  Food Insecurity: Not on file  Transportation Needs: Not on file  Physical Activity: Not on file  Stress: Not on file  Social Connections: Not on file     Family History: The patient's ***family history includes Cancer in her mother; Crohn's disease in her mother; Diverticulitis in her father; Hypertension in her mother. ROS:   Please see the history of present illness.    All other systems reviewed and are negative.  EKGs/Labs/Other Studies Reviewed:  The following studies were reviewed today:  EKG:  EKG ordered today and personally reviewed.  The ekg ordered today demonstrates ***  Recent Labs: 11/08/2020: TSH 2.890  Recent Lipid Panel No results found for: CHOL, TRIG, HDL, CHOLHDL, VLDL, LDLCALC, LDLDIRECT  Physical Exam:    VS:  There were no vitals taken for this visit.    Wt Readings from Last 3 Encounters:  05/11/21 168 lb (76.2 kg)  03/03/21 150 lb (68 kg)  11/11/20 162 lb 4 oz (73.6 kg)     GEN: *** Well nourished, well developed in no acute distress HEENT: Normal NECK: No JVD; No carotid bruits LYMPHATICS: No lymphadenopathy CARDIAC: ***RRR, no murmurs, rubs, gallops RESPIRATORY:  Clear to auscultation without rales,  wheezing or rhonchi  ABDOMEN: Soft, non-tender, non-distended MUSCULOSKELETAL:  No edema; No deformity  SKIN: Warm and dry NEUROLOGIC:  Alert and oriented x 3 PSYCHIATRIC:  Normal affect    Signed, Shirlee More, MD  08/22/2021 8:49 AM    [facility]

## 2021-08-23 ENCOUNTER — Telehealth: Payer: Self-pay | Admitting: *Deleted

## 2021-08-23 ENCOUNTER — Ambulatory Visit: Payer: 59 | Admitting: Cardiology

## 2021-08-23 NOTE — Telephone Encounter (Signed)
Spoke with Elyse Hsu at [facility] office to follow up about the heart monitor pt is supposed to wear. Elyse Hsu stated they had received the order but pt had not been scheduled yet. She stated she would call pt and get that set up so we can have report by April. Will follow up on this again in a couple weeks.

## 2021-08-25 ENCOUNTER — Other Ambulatory Visit: Payer: Self-pay | Admitting: Internal Medicine

## 2021-10-11 NOTE — Progress Notes (Addendum)
?Cardiology Office Note:   ? ?Date:  10/12/2021  ? ?ID:  Lynn Lyons, DOB 02/20/1981, MRN [id number] ? ?PCP:  Imagene Riches, NP  ?Cardiologist:  Shirlee More, MD   ? ?Referring MD: Imagene Riches, NP  ? ? ?ASSESSMENT:   ? ?1. Chest pain of uncertain etiology   ?2. Tachycardia   ?3. Palpitations   ?4. Long COVID   ? ?PLAN:   ? ?In order of problems listed above: ? ?He has a chronic chest pain pattern possible angina cigarette smoker middle-age plan cardiac CTA as below ?Symptoms of long COVID palpitation tachycardia improved off cyclobenzaprine which can cause marked reflex tachycardia alpha adrenergic blocking effect and to continue her beta-blocker as needed ? ? ?Next appointment: 6 months ? ? ?Medication Adjustments/Labs and Tests Ordered: ?Current medicines are reviewed at length with the patient today.  Concerns regarding medicines are outlined above.  ?No orders of the defined types were placed in this encounter. ? ?No orders of the defined types were placed in this encounter. ? ? ?Chief Complaint  ?Patient presents with  ? Palpitations  ? Tachycardia  ? ? ?History of Present Illness:   ? ?Lynn Lyons is a 41 y.o. female with a hx of palpitation rapid heart rate following COVID-19 infection and taking cyclobenzaprine and Effexor last seen 05/11/2021 and placed back on a beta-blocker for symptom relief. ? ?Compliance with diet, lifestyle and medications: Yes ? ?She is quite improved off for cyclobenzaprine but still needs to take her beta-blocker as needed and it is a good job controlling tachycardia ?She is concerned that she continues to have episodes of chest discomfort she uses the clutch posterior chest to describe tightness that occurs with and also without activities and also with a lot of emotional stress which is a problem in her life caring for her husband.  We discussed further evaluation and with her life responsibilities she will do cardiac CTA will guide [place] with a coronary calcium score  presence or absence of CAD congenital heart problems like coronary artery anomaly or aortopathy.  I think this would help her overall.  She is at increased cardiovascular risk with cigarette smoking ? ?I was able to review the monitor that she wore reported 09/23/2021.  Rhythm throughout sinus no episodes of atrial fibrillation flutter or SVT.  No sinus pauses or episodes of AV nodal block.  Rhythm sinus throughout with occasional PVC ? ?She had a cardiac echo performed [facility] 05/16/2019 showed normal left ventricular size wall thickness systolic function EF 55 to 60% normal right ventricular size and function and no valvular abnormality. ?She utilized a event monitor November 2020 results unavailable reports ?She was seen [facility] cardiology Dr. Lamonte Sakai May 09, 2019 for several complaints there is a note that she had COVID-19 pneumonia in June 2020 and subsequent palpitation and shortness of breath her EKG was described as sinus tachycardia otherwise normal.  He ordered a BNP level which was very low at 12.6 and an event monitor as well as the echocardiogram she was not seen in follow-up. ?She was seen by pulmonary [facility] notation she had pulmonary function test performed showing minimal obstruction 6-minute walk test did not show desaturation and notation that she was felt to have symptoms post viral syndrome/COVID. ?Past Medical History:  ?Diagnosis Date  ? Allergic rhinitis   ? Basal cell carcinoma 07/17/2019  ? Right Nasal Dorsum [facility])  ? Chronic migraine with aura 10/15/2018  ?  Chronic pain syndrome 03/03/2021  ? Cigarette smoker 06/17/2019  ? Dry mouth 09/08/2020  ? Fibromyalgia   ? Graves disease   ? Hyperthyroidism 08/17/2020  ? IBS (irritable bowel syndrome)   ? Incontinence without sensory awareness   ? Major depressive disorder with single episode   ? Migraines   ? Musculoskeletal back pain 03/03/2021  ? Patellofemoral pain syndrome of both knees  09/08/2020  ? Post viral syndrome 07/03/2019  ? Thyroid cyst   ? ? ?Past Surgical History:  ?Procedure Laterality Date  ? ABDOMINAL HYSTERECTOMY    ? CHOLECYSTECTOMY  2004  ? OOPHORECTOMY    ? PARTIAL HYSTERECTOMY    ? SKIN CANCER EXCISION  2021  ? TUBAL LIGATION    ? VAGINA SURGERY    ? upper cuff of vagina was fused to lower bowels  ? ? ?Current Medications: ?No outpatient medications have been marked as taking for the 10/12/21 encounter (Office Visit) with Richardo Priest, MD.  ?  ? ?Allergies:   Moxifloxacin, Amitriptyline, Depakote [divalproex sodium], Gabapentin, Milnacipran, Pregabalin, Valproic acid, and Wellbutrin [bupropion]  ? ?Social History  ? ?Socioeconomic History  ? Marital status: Married  ?  Spouse name: Not on file  ? Number of children: Not on file  ? Years of education: Not on file  ? Highest education level: Not on file  ?Occupational History  ? Not on file  ?Tobacco Use  ? Smoking status: Every Day  ?  Packs/day: 0.50  ?  Types: Cigarettes  ?  Passive exposure: Current  ? Smokeless tobacco: Never  ?Vaping Use  ? Vaping Use: Never used  ?Substance and Sexual Activity  ? Alcohol use: Not Currently  ? Drug use: Not Currently  ? Sexual activity: Not on file  ?Other Topics Concern  ? Not on file  ?Social History Narrative  ? Not on file  ? ?Social Determinants of Health  ? ?Financial Resource Strain: Not on file  ?Food Insecurity: Not on file  ?Transportation Needs: Not on file  ?Physical Activity: Not on file  ?Stress: Not on file  ?Social Connections: Not on file  ?  ? ?Family History: ?The patient's family history includes Cancer in her mother; Crohn's disease in her mother; Diverticulitis in her father; Hypertension in her mother. ?ROS:   ?Please see the history of present illness.    ?All other systems reviewed and are negative. ? ?EKGs/Labs/Other Studies Reviewed:   ? ?The following studies were reviewed today: ? ?EKG:   EKG performed 05/11/2021 showed sinus rhythm 98 bpm otherwise  normal ? ?Recent Labs: ?11/08/2020: TSH 2.890  ? ? ?Physical Exam:   ? ?VS:  BP 114/68 (BP Location: Left Arm)   Pulse 96   Ht '5\' 4"'$  (1.626 m)   Wt 148 lb (67.1 kg)   SpO2 98%   BMI 25.40 kg/m?    ? ?Wt Readings from Last 3 Encounters:  ?10/12/21 148 lb (67.1 kg)  ?05/11/21 168 lb (76.2 kg)  ?03/03/21 150 lb (68 kg)  ?  ? ?GEN:  Well nourished, well developed in no acute distress ?HEENT: Normal ?NECK: No JVD; No carotid bruits ?LYMPHATICS: No lymphadenopathy ?CARDIAC: RRR, no murmurs, rubs, gallops ?RESPIRATORY:  Clear to auscultation without rales, wheezing or rhonchi  ?ABDOMEN: Soft, non-tender, non-distended ?MUSCULOSKELETAL:  No edema; No deformity  ?SKIN: Warm and dry ?NEUROLOGIC:  Alert and oriented x 3 ?PSYCHIATRIC:  Normal affect  ? ? ?Signed, ?Shirlee More, MD  ?10/12/2021 9:10 AM    ?[facility]  ?

## 2021-10-12 ENCOUNTER — Ambulatory Visit (INDEPENDENT_AMBULATORY_CARE_PROVIDER_SITE_OTHER): Payer: Medicaid Other | Admitting: Cardiology

## 2021-10-12 ENCOUNTER — Encounter: Payer: Self-pay | Admitting: Cardiology

## 2021-10-12 VITALS — BP 114/68 | HR 96 | Ht 64.0 in | Wt 148.0 lb

## 2021-10-12 DIAGNOSIS — R079 Chest pain, unspecified: Secondary | ICD-10-CM | POA: Diagnosis not present

## 2021-10-12 DIAGNOSIS — R000 Tachycardia, unspecified: Secondary | ICD-10-CM | POA: Diagnosis not present

## 2021-10-12 DIAGNOSIS — R002 Palpitations: Secondary | ICD-10-CM

## 2021-10-12 DIAGNOSIS — U099 Post covid-19 condition, unspecified: Secondary | ICD-10-CM | POA: Diagnosis not present

## 2021-10-12 MED ORDER — METOPROLOL TARTRATE 100 MG PO TABS: 100.0000 mg | ORAL_TABLET | Freq: Once | ORAL | 0 refills | Status: DC

## 2021-10-12 NOTE — Patient Instructions (Signed)
Medication Instructions:  ?Your physician recommends that you continue on your current medications as directed. Please refer to the Current Medication list given to you today. ? ?*If you need a refill on your cardiac medications before your next appointment, please call your pharmacy* ? ? ?Lab Work: ? ?BMP 1 week before CT ? ?If you have labs (blood work) drawn today and your tests are completely normal, you will receive your results only by: ?MyChart Message (if you have MyChart) OR ?A paper copy in the mail ?If you have any lab test that is abnormal or we need to change your treatment, we will call you to review the results. ? ? ?Testing/Procedures: ? ? ?Your cardiac CT will be scheduled at one of the below locations:  ? ?[facility] ?[address] ?[place], [place] [address] ?[phone] ? ?OR ? ?[facility] [place] ?[address] ?[address] ?[place], [place] [address] ?[phone] ? ?If scheduled at [facility], please arrive at the [facility] Entrance (Entrance C2) of [facility] 30 minutes prior to test start time. ?You can use the FREE valet parking offered at entrance C (encouraged to control the heart rate for the test)  ?Proceed to the [facility] Radiology Department (first floor) to check-in and test prep. ? ?All radiology patients and guests should use entrance C2 at [facility], accessed from [facility], even though the hospital's physical address listed is [address]. ? ? ? ?If scheduled at [facility], please arrive 15 mins early for check-in and test prep. ? ?Please follow these instructions carefully (unless otherwise directed): ? ?On the Night Before the Test: ?Be sure to Drink plenty of water. ?Do not consume any caffeinated/decaffeinated beverages or chocolate 12 hours prior to your test. ?Do not take any antihistamines 12 hours prior to your test. ? ?On the Day of  the Test: ?Drink plenty of water until 1 hour prior to the test. ?Do not eat any food 4 hours prior to the test. ?You may take your regular medications prior to the test.  ?Take metoprolol (Lopressor) two hours prior to test. ?FEMALES- please wear underwire-free bra if available, avoid dresses & tight clothing ? ?     ?After the Test: ?Drink plenty of water. ?After receiving IV contrast, you may experience a mild flushed feeling. This is normal. ?On occasion, you may experience a mild rash up to 24 hours after the test. This is not dangerous. If this occurs, you can take Benadryl 25 mg and increase your fluid intake. ?If you experience trouble breathing, this can be serious. If it is severe call 911 IMMEDIATELY. If it is mild, please call our office. ? ?We will call to schedule your test 2-4 weeks out understanding that some insurance companies will need an authorization prior to the service being performed.  ? ?For non-scheduling related questions, please contact the cardiac imaging nurse navigator should you have any questions/concerns: ?Marchia Bond, Cardiac Imaging Nurse Navigator ?Gordy Clement, Cardiac Imaging Nurse Navigator ?[facility] ?Direct Office Dial: [phone]  ? ?For scheduling needs, including cancellations and rescheduling, please call [place], [phone]. ? ? ? ?Follow-Up: ?At [facility], you and your health needs are our priority.  As part of our continuing mission to provide you with exceptional heart care, we have created designated Provider Care Teams.  These Care Teams include your primary Cardiologist (physician) and Advanced Practice Providers (APPs -  Physician Assistants and Nurse Practitioners) who all work together to provide you with the care you need, when you need it. ? ?We recommend signing up for the patient portal called "MyChart".  Sign up information is provided on this After Visit Summary.  MyChart is used to connect with patients for  Virtual Visits (Telemedicine).  Patients are able to view lab/test results, encounter notes, upcoming appointments, etc.  Non-urgent messages can be sent to your provider as well.   ?To learn more about what you can do with MyChart, go to [email or web address].   ? ?Your next appointment:   ?6 month(s) ? ?The format for your next appointment:   ?In Person ? ?Provider:   ?Shirlee More, MD{ ? ? ?Other Instructions ?None ? ?Important Information About [place] ? ? ? ? ? ? ?

## 2021-10-12 NOTE — Addendum Note (Signed)
Addended by: Edwyna Shell I on: 10/12/2021 10:05 AM ? ? Modules accepted: Orders ? ?

## 2021-10-26 ENCOUNTER — Telehealth (HOSPITAL_COMMUNITY): Payer: Self-pay | Admitting: *Deleted

## 2021-10-26 NOTE — Telephone Encounter (Signed)
Reaching out to patient to offer assistance regarding upcoming cardiac imaging study; pt verbalizes understanding of appt date/time, parking situation and where to check in, pre-test NPO status and medications ordered, and verified current allergies; name and call back number provided for further questions should they arise ? ?Gordy Clement RN Navigator Cardiac Imaging ?[facility] ?[phone] office ?[phone] cell ? ?Patient to take '100mg'$  metoprolol tartrate two hours prior to her cardiac CT scan.  She is aware to be at the hospital by 7:30am. ?

## 2021-10-27 ENCOUNTER — Ambulatory Visit (HOSPITAL_COMMUNITY)
Admission: RE | Admit: 2021-10-27 | Discharge: 2021-10-27 | Disposition: A | Discharge status: Home / Self Care | Payer: Medicaid Other | Source: Ambulatory Visit | Attending: Cardiology | Admitting: Cardiology

## 2021-10-27 ENCOUNTER — Encounter (HOSPITAL_COMMUNITY): Payer: Self-pay

## 2021-10-27 DIAGNOSIS — R079 Chest pain, unspecified: Secondary | ICD-10-CM

## 2021-10-27 MED ORDER — IOHEXOL 350 MG/ML SOLN
100.0000 mL | Freq: Once | INTRAVENOUS | Status: AC | PRN
  Administered 2021-10-27: 100 mL via INTRAVENOUS

## 2021-10-27 MED ORDER — NITROGLYCERIN 0.4 MG SL SUBL
SUBLINGUAL_TABLET | SUBLINGUAL | Status: AC
  Filled 2021-10-27: qty 2

## 2021-10-27 MED ORDER — NITROGLYCERIN 0.4 MG SL SUBL
0.8000 mg | SUBLINGUAL_TABLET | Freq: Once | SUBLINGUAL | Status: AC
Start: 2021-10-27 — End: 2021-10-27
  Administered 2021-10-27: 0.8 mg via SUBLINGUAL

## 2021-10-31 ENCOUNTER — Institutional Professional Consult (permissible substitution): Payer: Medicaid Other | Admitting: Neurology

## 2021-11-01 ENCOUNTER — Telehealth: Payer: Self-pay | Admitting: Cardiology

## 2021-11-01 NOTE — Telephone Encounter (Signed)
Pt c/o of Chest Pain: STAT if CP now or developed within 24 hours ? ?1. Are you having CP right now? yes ? ?2. Are you experiencing any other symptoms (ex. SOB, nausea, vomiting, sweating)? tired ? ?3. How long have you been experiencing CP? All day ? ?4. Is your CP continuous or coming and going? continuous ? ?5. Have you taken Nitroglycerin? no ? ? ?Patient states her HR keeps jumping up and she has been having chest pain all day. She says she took her metoprolol, but it has not helped and is not sure if she needs to take another one. She says her HR has been around 117, and has gone from 125 to 105. She also says she feels more tired and shaky.  ? ?

## 2021-11-01 NOTE — Telephone Encounter (Signed)
Pt calling to state that she is having another episode of tachycardia that began between 9-10am this morning. HR is jumping from 105-125. Pt states she took her Metoprolol Succinate '25mg'$  at approx 11am, but it hasn't helped. Pt's BP currently at time of call is 116/80 and HR is 105. Pt does condone SOB and feeling shaky, but this is not abnormal for her during these episodes. She is calling because the one tablet usually fixes her HR, but today it hasn't. Pt condones that she has been under excessive stress lately and not sleeping much r/t her husband having a spinal injury. ?Per [facility]'s OV note on 10/12/21: ?She is quite improved off for cyclobenzaprine but still needs to take her beta-blocker as needed and it is a good job controlling tachycardia. ?Symptoms of long COVID palpitation tachycardia improved off cyclobenzaprine which can cause marked reflex tachycardia alpha adrenergic blocking effect and to continue her beta-blocker as needed ?  ?Pt asking if she can take an additional dose at this time due to tachycardia not resolving. Advised pt that I will route a note to Dr Geraldo Pitter (covering [place] today) for permission. Pt aware that drug is available in '50mg'$  and '100mg'$  formulations. Advised her that if she chooses to take an additional dose, she should monitor her BP and also, if no relief experienced, she should go to ED for evaluation as this would indicate progression of some type to her symptoms. Advised we would call back with a response from [place] when we have it. ?

## 2021-11-02 NOTE — Telephone Encounter (Signed)
Recommendations reviewed with pt as per Dr. Revankar's note.  Pt verbalized understanding and had no additional questions.   

## 2021-11-04 ENCOUNTER — Encounter: Payer: Self-pay | Admitting: Neurology

## 2021-11-04 ENCOUNTER — Ambulatory Visit: Payer: Medicaid Other | Admitting: Neurology

## 2021-11-04 VITALS — BP 123/76 | HR 74 | Ht 63.5 in | Wt 148.0 lb

## 2021-11-04 DIAGNOSIS — G43711 Chronic migraine without aura, intractable, with status migrainosus: Secondary | ICD-10-CM | POA: Diagnosis not present

## 2021-11-04 MED ORDER — UBRELVY 100 MG PO TABS: 100.0000 mg | ORAL_TABLET | ORAL | 11 refills | Status: DC | PRN

## 2021-11-04 MED ORDER — GALCANEZUMAB-GNLM 120 MG/ML ~~LOC~~ SOAJ: 240.0000 mg | Freq: Once | SUBCUTANEOUS | Status: DC

## 2021-11-04 MED ORDER — EMGALITY 120 MG/ML ~~LOC~~ SOAJ: SUBCUTANEOUS | 11 refills | Status: DC

## 2021-11-04 MED ORDER — EMGALITY 120 MG/ML ~~LOC~~ SOAJ: 120.0000 mg | SUBCUTANEOUS | 11 refills | Status: DC

## 2021-11-04 NOTE — Progress Notes (Signed)
?[facility] ? ? ? ?Provider:  Dr Jaynee Eagles ?Requesting Provider: Imagene Riches, NP ?Primary Care Provider:  Imagene Riches, NP ? ?CC:  Migraines, prior 08/2020 seen for Multiple neurologic symptoms (brain fog, tremors, and multiple other neurologic symptoms in the setting of hyperthyroidism and severe B12 deficiency.) ? ?HPI:  Lynn Lyons is a 41 y.o. female here as requested by Imagene Riches, NP for migraines.  Past medical history migraines, fibromyalgia, Graves' disease, kidney stones, UTI, IBS, hypoglycemia, UTI, RMSF, kidney reflux, hypertension, chronic pain syndrome, carpal tunnel syndrome right upper limb, major depressive disorder, incontinence mixed, menopausal, allergic rhinitis. I reviewed [name] notes: Patient is on [place] for chronic pain, she failed a drug screen with oxycodone, cyclobenzaprine was stopped and given tizanidine, she has a long history of migraines and has tried multiple medications she is currently on rizatriptan, patient states she has skin cancer, she also wants to see a kidney specialist for "kidney scarring", she wanted a heart monitor, [name] noted that she is a current every day smoker since the age of 14-1/2, rare alcohol no substance abuse, she is unemployed, had skin cancer surgery on her face, right wrist carpal tunnel surgery November 2022, vaginal surgery, cholecystectomy, tubal ligation, full hysterectomy, scarring on her face is from dermatologic procedures, musculoskeletal tenderness exist multiple tender areas throughout, she had a heart monitor for 14 days and electrocardiogram, I reviewed labs urine analysis appeared unremarkable, urine drug screen was negative except for oxycodone, CMP on August 30 2021 showed BUN 8 and creatinine 0.8 otherwise appears unremarkable, CBC showed hemoglobin 13.6 appears unremarkable.  B12 was 99 in the past and she is being treated with IM supplementation.  She has been referred to pain medicine. ? ?I saw  patient in March 2022 for multiple neurologic symptoms including brain fog, tremors, and multiple other neurologic symptoms in the setting of hyperthyroidism and severe B12 deficiency.  Today she has been referred for migraines.  Prior MRI of the lumbar spine 08/2020 was normal.  MRI of the brain in April 2022 was normal.  MRI of the cervical spine April 2022 was unremarkable essentially normal very minimal arthritic changes no foraminal or central stenosis. ? ?Patient is here for migraines. Daily migraines for a year. They start on the right temporoparietal area. She has had they in the past started 20 years ago and they are the same quality and severity and frequency as she has had in the past, she has a kaleidoscope in vision of both eyes, pulsating, pounding. Throbbing, light sensitivity, nausea, movement makes it worse, they have waxed and waned but 8 years ago had similar frequency and severity. They can last 24-72 hours. No associated focal neurologic symptoms. Rizatriptan helps. She has seen [facility]. Moderate to severe migraines. Unknown triggers.No other focal neurologic deficits, associated symptoms, inciting events or modifiable factors. ? ? ?Reviewed notes, labs and imaging from outside physicians, which showed: ? ?From a thorough review of records, medications tried that can be used in migraine management include: Meloxicam, rizatriptan, Keppra, tizanidine, Depakote (allergy), Lyrica (allergies), gabapentin (allergy), Wellbutrin (allergy), topiramate contraindicated due to history of kidney stones, metoprolol, trazodone, Effexor, sumatriptan, amitriptyline.  ? ?MRI brain 09/30/2020: normal, personally reviewed imaging and agree. ? ?MRI cervical spine 09/30/2020: IMPRESSION: This MRI of the cervical spine with and without contrast shows the following: ?1.   Spinal cord appears normal, before and after contrast ?2.   Mild disc degenerative changes at C4-C5, C5-C6 and C6-C7 that  does not lead to  nerve root compression or spinal stenosis. ?3.   Normal enhancement pattern. ? 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 Pertinent negatives and positives per HPI. All others negative ? ? ?HPI 08/31/2020:  Lynn Lyons is a 41 y.o. female here as requested by Imagene Riches, NP for brain fog, tremors, and multiple other neurologic symptoms in the setting of hyperthyroidism and severe B12 deficiency. ? ?She has recently found TSH issues and word-finding issues for several years. Also recently found B12 99. Ongoing for several years, worsening. She has tremors. She has started falling. Cold flashes across the top of her feet, she will lose her balance, she fell yesterday, she has a lot of trouble staying on track with conversation, she will forget if she is told something 3-5 minutes later, she has tremors (part of the graves disease) that was diagnosed a month ago, she has to write a note for her kids' school, her hand will start writing something that is different than in her brain, she wills tart writing something that has nothing to do, it makes sense and it is a good sentence (not word salad or bad spelling or gibberish), she denies distraction. She constantly has to scribble things out. Trouble following directions, she will start on step one and steo two and she has to reread the instructions because she can;t remember it, when she reads novels she has to reread things, no learning disabilities or ADHD as a child, she skipped the 8th grade, this is a huge change for her, she would remember everything (her husband hated it), she feels she can barely find enough words to talk often. She gets lost in conversation. She loses a thread, becoming a struggle. No hx of dementia in the family. She has been to Rheumatology and dxed with Fibromyalgia. She has ain all over her body, it hurts to even touch her skin, extremely painful, muscle and joint  pain, muscles painful she can;t have someone touch her arm, back, thigh, in the back of the head, sensitivity of the skin and pain of the muscles and joints. She will have numbness in the feet. No other focal neurologic deficits, associated symptoms, inciting events or modifiable factors. ? ?Reviewed notes, labs and imaging from outside physicians, which showed: ? ?MRI 08/2020 lumbar spine is normal, personally reviewed imaging, no reason seen for her imbalance/ataxia ? ?Review of Systems: ?Patient complains of symptoms per HPI as well as the following symptoms: multiple neurologic symptoms as per above. Pertinent negatives and positives per HPI. All others negative. ? ? ?Social History  ? ?Socioeconomic History  ? Marital status: Married  ?  Spouse name: Not on file  ? Number of children: Not on file  ? Years of education: Not on file  ? Highest education level: Not on file  ?Occupational History  ? Not on file  ?Tobacco Use  ? Smoking status: Every Day  ?  Packs/day: 0.50  ?  Types: Cigarettes  ?  Passive exposure: Current  ? Smokeless tobacco: Never  ?Vaping Use  ? Vaping Use: Never used  ?Substance and Sexual Activity  ? Alcohol use: Not Currently  ? Drug use: Not Currently  ? Sexual activity: Not on file  ?Other Topics Concern  ? Not on file  ?Social History Narrative  ? Lives at home with husband and children  ? Right handed  ? Caffeine: 1-2 cups of  hot tea in the morning, pepsi with dinner  ? ?Social Determinants of Health  ? ?Financial Resource Strain: Not on file  ?Food Insecurity: Not on file  ?Transportation Needs: Not on file  ?Physical Activity: Not on file  ?Stress: Not on file  ?Social Connections: Not on file  ?Intimate Partner Violence: Not on file  ? ? ?Family History  ?Problem Relation Age of Onset  ? Cancer Mother   ?     Colorectal  ? Crohn's disease Mother   ? Hypertension Mother   ? Diverticulitis Father   ? Migraines Neg Hx   ? ? ?Past Medical History:  ?Diagnosis Date  ? Allergic rhinitis   ?  Basal cell carcinoma 07/17/2019  ? Right Nasal Dorsum [facility])  ? Chronic migraine with aura 10/15/2018  ? Chronic pain syndrome 03/03/2021  ? Cigarette smoker 06/17/2019  ? Dry mouth 0

## 2021-11-04 NOTE — Patient Instructions (Addendum)
Prevention: Start Emgality (see information below) ?Acute management: Start Ubrelvy. Please take one tablet at the onset of your headache. If it does not improve the symptoms please take one additional tablet. Do not take more then 2 tablets in 24hrs.  ?Rebound Headaches: Do not take Over the counter analgesics(see below) or opiods and other medications as below more than 10 days a month. This does NOT include Ubrelvy.  ?- Dicussed risk of stroke in migraine with aura, hormone replacement therapy is contraindicated ?- Smoking can make migraines worse ? ? ?There is increased risk for stroke in women with migraine with aura and a contraindication for the combined contraceptive pill for use by women who have migraine with aura. The risk for women with migraine without aura is lower. However other risk factors like smoking are far more likely to increase stroke risk than migraine. There is a recommendation for no smoking and for the use of OCPs without estrogen such as progestogen only pills particularly for women with migraine with aura.Marland Kitchen People who have migraine headaches with auras may be 3 times more likely to have a stroke caused by a blood clot, compared to migraine patients who don't see auras. Women who take hormone-replacement therapy may be 30 percent more likely to suffer a clot-based stroke than women not taking medication containing estrogen. Other risk factors like smoking and high blood pressure may be  much more important. ? ?Analgesic Rebound Headache ?An analgesic rebound headache, sometimes called a medication overuse headache or a drug-induced headache, is a secondary disorder that is caused by the overuse of pain medicine (analgesic) to treat the original (primary) headache. Any type of primary headache can return as a rebound headache if a person regularly takes analgesics. ?The types of primary headaches that are commonly associated with rebound headaches include: ?Migraines. ?Headaches that are  caused by tense muscles in the head and neck area (tension headaches). ?Headaches that develop and happen again on one side of the head and around the eye (cluster headaches). ?If rebound headaches continue, they can become long-term, daily headaches. ?What are the causes? ?This condition may be caused by frequent use of: ?Over-the-counter medicines such as aspirin, ibuprofen, and acetaminophen. ?Sinus-relief medicines and medicines that contain caffeine. ?Narcotic pain medicines such as codeine and oxycodone. ?Some prescription migraine medicines. ?What are the signs or symptoms? ?The symptoms of a rebound headache are the same as the symptoms of the original headache. Some of the symptoms of specific types of headaches include: ?Migraine headache ?Pulsing or throbbing pain on one or both sides of the head. ?Severe pain that interferes with daily activities. ?Pain that gets worse with physical activity. ?Nausea, vomiting, or both. ?Pain and sensitivity with exposure to bright light, loud noises, or strong smells. ?Visual changes. ?Numbness of one or both arms. ?Tension headache ?Pressure around the head. ?Dull, aching head pain. ?Pain felt over the front and sides of the head. ?Tenderness in the muscles of the head, neck, and shoulders. ?Cluster headache ?Severe pain that begins in or around one eye or temple. ?Droopy or swollen eyelid, or redness and tearing in the eye on the same side as the pain. ?One-sided head pain. ?Nausea. ?Runny nose. ?Sweaty, pale facial skin. ?Restlessness. ?How is this diagnosed? ?This condition is diagnosed by: ?Reviewing your medical history. This includes the nature of your primary headaches. ?Reviewing the types of pain medicines that you have been using to treat your primary headaches and how often you take them. ?How is this treated? ?  This condition may be treated or managed by: ?Discontinuing frequent use of the analgesic medicine. Doing this may worsen your headaches at first,  but the pain should eventually become more manageable, less frequent, and less severe. ?Seeing a headache specialist. He or she may be able to help you manage your headaches and help make sure there is not another cause of the headaches. ?Using methods of stress relief, such as acupuncture, counseling, biofeedback, and massage. Talk with your health care provider about which methods might be good for you. ?Follow these instructions at home: ?Medicines ? ?Take over-the-counter and prescription medicines only as told by your health care provider. ?Stop the repeated use of pain medicine as told by your health care provider. Stopping can be difficult. Carefully follow instructions from your health care provider. ?Lifestyle ? ?Follow a regular sleep schedule. Do not vary the time that you go to bed or the amount that you sleep from day to day. It is important to stay on the same schedule to help prevent headaches. Get 7-9 hours of sleep each night, or the amount recommended by your health care provider. ?Exercise regularly. Exercise for at least 30 minutes, 5 times each week. ?Limit or manage stress. Consider stress-relief options such as acupuncture, counseling, biofeedback, and massage. Talk with your health care provider about which methods might be good for you. ?Do not drink alcohol. ?Do not use any products that contain nicotine or tobacco, such as cigarettes, e-cigarettes, and chewing tobacco. If you need help quitting, ask your health care provider. ?General instructions ?Avoid triggers that are known to cause your primary headaches. ?Keep all follow-up visits as told by your health care provider. This is important. ?Contact a health care provider if: ?You continue to experience headaches after following treatments that your health care provider recommended. ?Get help right away if you have: ?New headache pain. ?Headache pain that is different than what you have experienced in the past. ?Numbness or tingling in  your arms or legs. ?Changes in your speech or vision. ?Summary ?An analgesic rebound headache, sometimes called a medication overuse headache or a drug-induced headache, is caused by the overuse of pain medicine (analgesic) to treat the original (primary) headache. ?Any type of primary headache can return as a rebound headache if a person regularly takes analgesics. The types of primary headaches that are commonly associated with rebound headaches include migraines, tension headaches, and cluster headaches. ?Analgesic rebound headaches can occur with frequent use of over-the-counter medicines and prescription medicines. ?Treatment involves stopping the medicine that is being overused. This will improve headache frequency and severity. ?This information is not intended to replace advice given to you by your health care provider. Make sure you discuss any questions you have with your health care provider. ?Document Revised: 07/10/2019 Document Reviewed: 07/10/2019 ?Elsevier Patient Education ? [place]. ? ?Galcanezumab Injection ?What is this medication? ?GALCANEZUMAB (gal ka NEZ ue mab) prevents migraines. It works by blocking a substance in the body that causes migraines. It may also be used to treat cluster headaches. It is a monoclonal antibody. ?This medicine may be used for other purposes; ask your health care provider or pharmacist if you have questions. ?COMMON BRAND NAME(S): Emgality ?What should I tell my care team before I take this medication? ?They need to know if you have any of these conditions: ?An unusual or allergic reaction to galcanezumab, other medications, foods, dyes, or preservatives ?Pregnant or trying to get pregnant ?Breast-feeding ?How should I use this medication? ?This  medication is injected under the skin. You will be taught how to prepare and give it. Take it as directed on the prescription label. Keep taking it unless your care team tells you to stop. ?It is important that you  put your used needles and syringes in a special sharps container. Do not put them in a trash can. If you do not have a sharps container, call your pharmacist or care team to get one. ?Talk to your care t

## 2021-11-08 ENCOUNTER — Telehealth: Payer: Self-pay | Admitting: *Deleted

## 2021-11-08 NOTE — Telephone Encounter (Signed)
Lina Sar (KeyColbert Coyer) ?Rx #: [id number] ?Emgality '120MG'$ /ML auto-injectors (migraine) ? ?PA Emgality complete waiting on approval  ?

## 2021-11-08 NOTE — Telephone Encounter (Signed)
Lynn Lyons KeyJefferey Pica - PA Case ID: [id number] - Rx #: [id number] ? ?PA complete  waiting on approval  ?

## 2021-11-09 NOTE — Telephone Encounter (Addendum)
Approved on May 16 ?Approved. This drug has been approved. Approved quantity: 16 units per 8 day(s). The drug has been approved from 10/25/2021 to 11/08/2022. Please call the pharmacy to process your prescription claim. Generic or biosimilar substitution may be required when available and preferred on the formulary. ? ?Approval faxed to pharmacy. Received a receipt of confirmation. ? ?

## 2021-11-09 NOTE — Telephone Encounter (Signed)
Approved. This drug has been approved. Approved quantity: 1 units per 30 day(s). The drug has been approved from 10/25/2021 to 02/06/2022. Please call the pharmacy to process your prescription claim. Generic or biosimilar substitution may be required when available and preferred on the formulary. ? ?Approval letter faxed to pharmacy. Received a receipt of confirmation. ? ?

## 2021-11-20 ENCOUNTER — Other Ambulatory Visit: Payer: Self-pay | Admitting: Internal Medicine

## 2021-12-01 ENCOUNTER — Encounter: Payer: Self-pay | Admitting: Family

## 2022-01-02 ENCOUNTER — Other Ambulatory Visit: Payer: Self-pay | Admitting: Internal Medicine

## 2022-01-05 ENCOUNTER — Other Ambulatory Visit: Payer: Self-pay | Admitting: Cardiology

## 2022-03-09 NOTE — Patient Instructions (Signed)
Below is our plan:  We will continue Emgality and Ubrelvy.  Please make sure you are staying well hydrated. I recommend 50-60 ounces daily. Well balanced diet and regular exercise encouraged. Consistent sleep schedule with 6-8 hours recommended.   Please continue follow up with care team as directed.   Follow up with me in 1 year  You may receive a survey regarding today's visit. I encourage you to leave honest feed back as I do use this information to improve patient care. Thank you for seeing me today!    

## 2022-03-09 NOTE — Progress Notes (Unsigned)
PATIENT: Lynn Lyons DOB: 12-31-1980  REASON FOR VISIT: follow up HISTORY FROM: patient  Virtual Visit via Telephone Note  I connected with Lynn Lyons on 03/14/22 at  8:00 AM EDT by telephone and verified that I am speaking with the correct person using two identifiers.   I discussed the limitations, risks, security and privacy concerns of performing an evaluation and management service by telephone and the availability of in person appointments. I also discussed with the patient that there may be a patient responsible charge related to this service. The patient expressed understanding and agreed to proceed.   History of Present Illness:  03/14/22 ALL: Lynn Lyons is a 41 y.o. female here today for follow up for migraines. She was started on Lynn Lyons at last visit with Lynn Lynn Lyons 10/2021. She is doing very well. She may have 3-4 headache days a month. Lynn Lyons works fast to abort headaches. She did have more headaches last month but contributes to stress. She is tolerating medicaitons well and without concerns, today.   History (copied from Lynn Lyons previous note)  HPI:  Lynn Lyons is a 41 y.o. female here as requested by Lynn Riches, NP for migraines.  Past medical history migraines, fibromyalgia, Graves' disease, kidney stones, UTI, IBS, hypoglycemia, UTI, RMSF, kidney reflux, hypertension, chronic pain syndrome, carpal tunnel syndrome right upper limb, major depressive disorder, incontinence mixed, menopausal, allergic rhinitis. I reviewed Lynn Lyons notes: Patient is on Lynn Lyons for chronic pain, she failed a drug screen with oxycodone, cyclobenzaprine was stopped and given tizanidine, she has a long history of migraines and has tried multiple medications she is currently on rizatriptan, patient states she has skin cancer, she also wants to see a kidney specialist for "kidney scarring", she wanted a heart monitor, Lynn Lyons noted that she is a current  every day smoker since the age of 14-1/2, rare alcohol no substance abuse, she is unemployed, had skin cancer surgery on her face, right wrist carpal tunnel surgery November 2022, vaginal surgery, cholecystectomy, tubal ligation, full hysterectomy, scarring on her face is from dermatologic procedures, musculoskeletal tenderness exist multiple tender areas throughout, she had a heart monitor for 14 days and electrocardiogram, I reviewed labs urine analysis appeared unremarkable, urine drug screen was negative except for oxycodone, CMP on August 30 2021 showed BUN 8 and creatinine 0.8 otherwise appears unremarkable, CBC showed hemoglobin 13.6 appears unremarkable.  B12 was 99 in the past and she is being treated with IM supplementation.  She has been referred to pain medicine.  I saw patient in March 2022 for multiple neurologic symptoms including brain fog, tremors, and multiple other neurologic symptoms in the setting of hyperthyroidism and severe B12 deficiency.  Today she has been referred for migraines.  Prior MRI of the lumbar spine 08/2020 was normal.  MRI of the brain in April 2022 was normal.  MRI of the cervical spine April 2022 was unremarkable essentially normal very minimal arthritic changes no foraminal or central stenosis.  Patient is here for migraines. Daily migraines for a year. They start on the right temporoparietal area. She has had they in the past started 20 years ago and they are the same quality and severity and frequency as she has had in the past, she has a kaleidoscope in vision of both eyes, pulsating, pounding. Throbbing, light sensitivity, nausea, movement makes it worse, they have waxed and waned but 8 years ago had similar frequency and severity. They can last 24-72 hours. No associated focal  neurologic symptoms. Rizatriptan helps. She has seen Lynn Lyons. Moderate to severe migraines. Unknown triggers.No other focal neurologic deficits, associated symptoms, inciting  events or modifiable factors  Reviewed notes, labs and imaging from outside physicians, which showed:  From a thorough review of records, medications tried that can be used in migraine management include: Meloxicam, rizatriptan, Keppra, tizanidine, Depakote (allergy), Lyrica (allergies), gabapentin (allergy), Wellbutrin (allergy), topiramate contraindicated due to history of kidney stones, metoprolol, trazodone, Effexor, sumatriptan, amitriptyline.   MRI brain 09/30/2020: normal, personally reviewed imaging and agree.  MRI cervical spine 09/30/2020: IMPRESSION: This MRI of the cervical spine with and without contrast shows the following: 1.   Spinal cord appears normal, before and after contrast 2.   Mild disc degenerative changes at C4-C5, C5-C6 and C6-C7 that does not lead to nerve root compression or spinal stenosis. 3.   Normal enhancement pattern.  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 Pertinent negatives and positives per HPI. All others negative   HPI 08/31/2020:  Lynn Lyons is a 41 y.o. female here as requested by Lynn Riches, NP for brain fog, tremors, and multiple other neurologic symptoms in the setting of hyperthyroidism and severe B12 deficiency.  She has recently found TSH issues and word-finding issues for several years. Also recently found B12 99. Ongoing for several years, worsening. She has tremors. She has started falling. Cold flashes across the top of her feet, she will lose her balance, she fell yesterday, she has a lot of trouble staying on track with conversation, she will forget if she is told something 3-5 minutes later, she has tremors (part of the graves disease) that was diagnosed a month ago, she has to write a note for her kids' school, her hand will start writing something that is different than in her brain, she wills tart writing something that has nothing to do, it makes sense and it is  a good sentence (not word salad or bad spelling or gibberish), she denies distraction. She constantly has to scribble things out. Trouble following directions, she will start on step one and steo two and she has to reread the instructions because she can;t remember it, when she reads novels she has to reread things, no learning disabilities or ADHD as a child, she skipped the 8th grade, this is a huge change for her, she would remember everything (her husband hated it), she feels she can barely find enough words to talk often. She gets lost in conversation. She loses a thread, becoming a struggle. No hx of dementia in the family. She has been to Rheumatology and dxed with Fibromyalgia. She has ain all over her body, it hurts to even touch her skin, extremely painful, muscle and joint pain, muscles painful she can;t have someone touch her arm, back, thigh, in the back of the head, sensitivity of the skin and pain of the muscles and joints. She will have numbness in the feet. No other focal neurologic deficits, associated symptoms, inciting events or modifiable factors.  Reviewed notes, labs and imaging from outside physicians, which showed:  MRI 08/2020 lumbar spine is normal, personally reviewed imaging, no reason seen for her imbalance/ataxia   Observations/Objective:  Generalized: Well developed, in no acute distress  Mentation: Alert oriented to time, place, history taking. Follows all commands speech and language fluent   Assessment and Plan:  41 y.o. 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 here with    ICD-10-CM   1. Chronic migraine without aura, with intractable migraine, so stated, with status migrainosus  G43.711       Lorel reports headaches are significantly improved. She will continue [place]. Healthy lifestyle habits encouraged. She will follow up in 1 year.   No orders of the defined types were placed in this encounter.   No orders of the defined types were placed in this encounter.    Follow Up Instructions:  I discussed the assessment and treatment plan with the patient. The patient was provided an opportunity to ask questions and all were answered. The patient agreed with the plan and demonstrated an understanding of the instructions.   The patient was advised to call back or seek an in-person evaluation if the symptoms worsen or if the condition fails to improve as anticipated.  I provided 15 minutes of non-face-to-face time during this encounter. Patient located at their place of residence during [place] visit. Provider is in the office.    Debbora Presto, NP

## 2022-03-14 ENCOUNTER — Telehealth (INDEPENDENT_AMBULATORY_CARE_PROVIDER_SITE_OTHER): Payer: Medicaid Other | Admitting: Family Medicine

## 2022-03-14 ENCOUNTER — Encounter: Payer: Self-pay | Admitting: Family Medicine

## 2022-03-14 DIAGNOSIS — G43711 Chronic migraine without aura, intractable, with status migrainosus: Secondary | ICD-10-CM

## 2022-03-21 NOTE — Telephone Encounter (Signed)
New PA submitted for Emgality '120MG'$ /ML auto-injectors.  Key [id number]. Please wait for [facility] [employer] 2017 to return a determination.

## 2022-03-22 NOTE — Telephone Encounter (Signed)
Approved. This drug has been approved. Approved quantity: 1 units per 30 day(s). The drug has been approved from 03/07/2022 to 03/21/2023. Please call the pharmacy to process your prescription claim. Generic or biosimilar substitution may be required when available and preferred on the formulary.

## 2022-05-28 ENCOUNTER — Other Ambulatory Visit: Payer: Self-pay | Admitting: Cardiology

## 2022-05-31 NOTE — Progress Notes (Unsigned)
Cardiology Office Note:    Date:  06/01/2022   ID:  Lynn Lyons, DOB [birth year], MRN [id number]  PCP:  Rhea Bleacher, NP  Cardiologist:  Shirlee More, MD    Referring MD: Rhea Bleacher, NP    ASSESSMENT:    1. Chest pain of uncertain etiology   2. Coronary-myocardial bridge   3. Tachycardia   4. Palpitations   5. Long COVID    PLAN:    In order of problems listed above:  Fortunately cardiac CTA is normal except for the curiosity bruits she is improved as no evidence of significant heart disease and needs no further cardiac diagnostic testing Improved she will continue taking beta-blocker  Next appointment: I will plan to see her back in the office in the future as needed   Medication Adjustments/Labs and Tests Ordered: Current medicines are reviewed at length with the patient today.  Concerns regarding medicines are outlined above.  Orders Placed This Encounter  Procedures   EKG 12-Lead   No orders of the defined types were placed in this encounter.   Follow-up after cardiac CTA   History of Present Illness:    Lynn Lyons is a 41 y.o. female with a hx of Graves' disease on suppressive therapy with methimazole palpitation and rapid heart rate following COVID-19 infection and associated with cyclobenzaprine and Effexor last seen 10/12/2021 for chest pain she has had extensive testing performed previously summarized below.  I was able to review the monitor that she wore reported 09/23/2021.  Rhythm throughout sinus no episodes of atrial fibrillation flutter or SVT.  No sinus pauses or episodes of AV nodal block.  Rhythm sinus throughout with occasional PVC   She had a cardiac echo performed [facility] 05/16/2019 showed normal left ventricular size wall thickness systolic function EF 55 to 60% normal right ventricular size and function and no valvular abnormality. She utilized a event monitor November 2020 results unavailable reports  She was seen  [facility] cardiology Dr. Lamonte Sakai May 09, 2019 for several complaints there is a note that she had COVID-19 pneumonia in June 2020 and subsequent palpitation and shortness of breath her EKG was described as sinus tachycardia otherwise normal.  He ordered a BNP level which was very low at 12.6 and an event monitor as well as the echocardiogram she was not seen in follow-up.  She was seen by pulmonary [facility] notation she had pulmonary function test performed showing minimal obstruction 6-minute walk test did not show desaturation and notation that she was felt to have symptoms post viral syndrome/COVID.  Compliance with diet, lifestyle and medications: Yes  Reviewed her cardiac CTA with her myocardial bridge is a curiosity That she is not having angina but she continues to take Zanaflex and has episodes of lightheadedness. Has done better with the beta-blocker he takes daily for rapid heart rate  She had a cardiac CTA performed 10/28/2021.  The coronary calcium score was 0 coronary arteries were normal myocardial bridge was noted in the LAD not described as deep or extensive. 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  Major depressive disorder with single episode    Migraines    Musculoskeletal back pain 03/03/2021   Patellofemoral pain syndrome of both knees 09/08/2020   Post viral syndrome 07/03/2019   Thyroid cyst     Past Surgical History:  Procedure Laterality Date   ABDOMINAL HYSTERECTOMY     CHOLECYSTECTOMY  2004   OOPHORECTOMY     PARTIAL HYSTERECTOMY     SKIN CANCER EXCISION  2021   TUBAL  LIGATION     VAGINA SURGERY     upper cuff of vagina was fused to lower bowels    Current Medications: Current Meds  Medication Sig   cyanocobalamin (,VITAMIN B-12,) 1000 MCG/ML injection Inject 1,000 mcg into the muscle every 30 (thirty) days.   Galcanezumab-gnlm (EMGALITY) 120 MG/ML SOAJ Inject 120 mg into the skin every 30 (thirty) days.   levocetirizine (XYZAL) 5 MG tablet Take 1 tablet by mouth as needed.   meloxicam (MOBIC) 15 MG tablet Take 15 mg by mouth daily.   methimazole (TAPAZOLE) 5 MG tablet TAKE 1/2 TABLET BY MOUTH EVERY DAY   oxybutynin (DITROPAN-XL) 10 MG 24 hr tablet Take 10 mg by mouth at bedtime.   tiZANidine (ZANAFLEX) 4 MG tablet Take 4 mg by mouth 3 (three) times daily.   Ubrogepant (UBRELVY) 100 MG TABS Take 100 mg by mouth every 2 (two) hours as needed. Maximum '200mg'$  a day.     Allergies:   Moxifloxacin, Amitriptyline, Depakote [divalproex sodium], Gabapentin, Milnacipran, Pregabalin, Valproic acid, and Wellbutrin [bupropion]   Social History   Socioeconomic History   Marital status: Married    Spouse name: Not on file   Number of children: Not on file   Years of education: Not on file   Highest education level: Not on file  Occupational History   Not on file  Tobacco Use   Smoking status: Every Day    Packs/day: 0.50    Types: Cigarettes    Passive exposure: Current   Smokeless tobacco: Never  Vaping Use   Vaping Use: Never used  Substance and Sexual Activity   Alcohol use: Not Currently   Drug use: Not Currently   Sexual activity: Not on file  Other Topics Concern   Not on file  Social History Narrative   Lives at home with husband and children   Right handed   Caffeine: 1-2 cups of hot tea in the morning, pepsi with dinner   Social Determinants of Health   Financial Resource Strain: Not on file  Food Insecurity: Not on file  Transportation Needs: Not on file  Physical Activity: Not on file  Stress: Not on file  Social Connections:  Not on file     Family History: The patient's family history includes Cancer in her mother; Crohn's disease in her mother; Diverticulitis in her father; Hypertension in her mother. There is no history of Migraines. ROS:   Please see the history of present illness.    All other systems reviewed and are negative.  EKGs/Labs/Other Studies Reviewed:    The following studies were reviewed today:   Physical Exam:    VS:  BP 138/70 (BP Location: Right Arm, Patient Position: Sitting)   Pulse 95   Ht '5\' 4"'$  (1.626 m)   Wt 154 lb (69.9 kg)   SpO2 98%   BMI 26.43 kg/m     Wt Readings from Last 3 Encounters:  06/01/22 154 lb (69.9 kg)  11/04/21 148 lb (67.1 kg)  10/12/21 148 lb (67.1 kg)  GEN:  Well nourished, well developed in no acute distress HEENT: Normal NECK: No JVD; No carotid bruits LYMPHATICS: No lymphadenopathy CARDIAC: RRR, no murmurs, rubs, gallops RESPIRATORY:  Clear to auscultation without rales, wheezing or rhonchi  ABDOMEN: Soft, non-tender, non-distended MUSCULOSKELETAL:  No edema; No deformity  SKIN: Warm and dry NEUROLOGIC:  Alert and oriented x 3 PSYCHIATRIC:  Normal affect    Signed, Shirlee More, MD  06/01/2022 9:55 AM    [place]

## 2022-06-01 ENCOUNTER — Encounter: Payer: Self-pay | Admitting: Cardiology

## 2022-06-01 ENCOUNTER — Ambulatory Visit: Payer: Medicaid Other | Attending: Cardiology | Admitting: Cardiology

## 2022-06-01 VITALS — BP 138/70 | HR 95 | Ht 64.0 in | Wt 154.0 lb

## 2022-06-01 DIAGNOSIS — R000 Tachycardia, unspecified: Secondary | ICD-10-CM

## 2022-06-01 DIAGNOSIS — R002 Palpitations: Secondary | ICD-10-CM | POA: Diagnosis not present

## 2022-06-01 DIAGNOSIS — U099 Post covid-19 condition, unspecified: Secondary | ICD-10-CM

## 2022-06-01 DIAGNOSIS — Q245 Malformation of coronary vessels: Secondary | ICD-10-CM | POA: Diagnosis not present

## 2022-06-01 DIAGNOSIS — R079 Chest pain, unspecified: Secondary | ICD-10-CM

## 2022-06-01 NOTE — Patient Instructions (Addendum)
Medication Instructions:  Your physician recommends that you continue on your current medications as directed. Please refer to the Current Medication list given to you today.  *If you need a refill on your cardiac medications before your next appointment, please call your pharmacy*   Lab Work: None If you have labs (blood work) drawn today and your tests are completely normal, you will receive your results only by: [place] (if you have MyChart) OR A paper copy in the mail If you have any lab test that is abnormal or we need to change your treatment, we will call you to review the results.   Testing/Procedures: None   Follow-Up: At [facility], you and your health needs are our priority.  As part of our continuing mission to provide you with exceptional heart care, we have created designated Provider Care Teams.  These Care Teams include your primary Cardiologist (physician) and Advanced Practice Providers (APPs -  Physician Assistants and Nurse Practitioners) who all work together to provide you with the care you need, when you need it.  We recommend signing up for the patient portal called "MyChart".  Sign up information is provided on this After Visit Summary.  MyChart is used to connect with patients for Virtual Visits (Telemedicine).  Patients are able to view lab/test results, encounter notes, upcoming appointments, etc.  Non-urgent messages can be sent to your provider as well.   To learn more about what you can do with MyChart, go to [email or web address].    Your next appointment:   Follow up as needed  The format for your next appointment:   In Person  Provider:   Shirlee More, MD    Other Instructions None  Important Information About Sugar         1. Avoid all over-the-counter antihistamines except Claritin/Loratadine and Zyrtec/Cetrizine. 2. Avoid all combination including cold sinus allergies flu decongestant and sleep medications 3. You  can use Robitussin DM Mucinex and Mucinex DM for cough. 4. can use Tylenol aspirin ibuprofen and naproxen but no combinations such as sleep or sinus.

## 2022-08-22 ENCOUNTER — Other Ambulatory Visit: Payer: Self-pay | Admitting: Cardiology

## 2022-10-11 ENCOUNTER — Other Ambulatory Visit: Payer: Self-pay

## 2022-10-11 ENCOUNTER — Other Ambulatory Visit: Payer: Self-pay | Admitting: Cardiology

## 2022-10-11 NOTE — Telephone Encounter (Signed)
Rx to pharmacy

## 2022-11-22 ENCOUNTER — Other Ambulatory Visit (HOSPITAL_COMMUNITY): Payer: Self-pay

## 2022-11-22 ENCOUNTER — Telehealth: Payer: Self-pay

## 2022-11-22 NOTE — Telephone Encounter (Signed)
Pharmacy Patient Advocate Encounter   Received notification from [facility] that prior authorization for Ubrelvy 100MG  tablets is required/requested.   PA submitted on 11/22/2022 to (ins) [facility] via CoverMyMeds Key or (Medicaid) confirmation # [id number] Status is pending

## 2022-11-23 ENCOUNTER — Other Ambulatory Visit (HOSPITAL_COMMUNITY): Payer: Self-pay

## 2022-11-23 NOTE — Telephone Encounter (Signed)
Pharmacy Patient Advocate Encounter  Prior Authorization for Ubrelvy 100MG  tablets has been approved by [facility] Medicaid of [place] (ins).    PA # PA Case ID #: [id number] Effective dates: 11/08/2022 through 11/22/2023

## 2022-12-05 ENCOUNTER — Other Ambulatory Visit: Payer: Self-pay | Admitting: Student

## 2022-12-05 ENCOUNTER — Other Ambulatory Visit: Payer: Self-pay | Admitting: Neurology

## 2022-12-05 DIAGNOSIS — G43711 Chronic migraine without aura, intractable, with status migrainosus: Secondary | ICD-10-CM

## 2022-12-05 DIAGNOSIS — Z1231 Encounter for screening mammogram for malignant neoplasm of breast: Secondary | ICD-10-CM

## 2022-12-08 ENCOUNTER — Ambulatory Visit
Admission: RE | Admit: 2022-12-08 | Discharge: 2022-12-08 | Disposition: A | Discharge status: Home / Self Care | Payer: Medicaid Other | Source: Ambulatory Visit | Attending: Family | Admitting: Family

## 2022-12-08 DIAGNOSIS — Z1231 Encounter for screening mammogram for malignant neoplasm of breast: Secondary | ICD-10-CM

## 2023-03-20 ENCOUNTER — Telehealth: Payer: Medicaid Other | Admitting: Family Medicine

## 2023-03-20 NOTE — Progress Notes (Deleted)
PATIENT: Lynn Lyons DOB: 1980-07-26  REASON FOR VISIT: follow up HISTORY FROM: patient  Virtual Visit via Telephone Note  I connected with Lynn Lyons on 03/20/23 at  9:00 AM EDT by telephone and verified that I am speaking with the correct person using two identifiers.   I discussed the limitations, risks, security and privacy concerns of performing an evaluation and management service by telephone and the availability of in person appointments. I also discussed with the patient that there may be a patient responsible charge related to this service. The patient expressed understanding and agreed to proceed.   History of Present Illness:  03/20/23 ALL: Lynn Lyons returns for follow up for migraines. She continues [place].   03/14/2022 ALL: Lynn Lyons is a 42 y.o. female here today for follow up for migraines. She was started on [place] at last visit with Dr Lucia Gaskins 10/2021. She is doing very well. She may have 3-4 headache days a month. Lynn Lyons works fast to abort headaches. She did have more headaches last month but contributes to stress. She is tolerating medicaitons well and without concerns, today.   History (copied from Dr [name] previous note)  HPI:  Lynn Lyons is a 42 y.o. female here as requested by [name], NP for migraines.  Past medical history migraines, fibromyalgia, Graves' disease, kidney stones, UTI, IBS, hypoglycemia, UTI, RMSF, kidney reflux, hypertension, chronic pain syndrome, carpal tunnel syndrome right upper limb, major depressive disorder, incontinence mixed, menopausal, allergic rhinitis. I reviewed Lynn Lyons notes: Patient is on Keppra for chronic pain, she failed a drug screen with oxycodone, cyclobenzaprine was stopped and given tizanidine, she has a long history of migraines and has tried multiple medications she is currently on rizatriptan, patient states she has skin cancer, she also wants to see a kidney  specialist for "kidney scarring", she wanted a heart monitor, [name] noted that she is a current every day smoker since the age of 14-1/2, rare alcohol no substance abuse, she is unemployed, had skin cancer surgery on her face, right wrist carpal tunnel surgery November 2022, vaginal surgery, cholecystectomy, tubal ligation, full hysterectomy, scarring on her face is from dermatologic procedures, musculoskeletal tenderness exist multiple tender areas throughout, she had a heart monitor for 14 days and electrocardiogram, I reviewed labs urine analysis appeared unremarkable, urine drug screen was negative except for oxycodone, CMP on August 30 2021 showed BUN 8 and creatinine 0.8 otherwise appears unremarkable, CBC showed hemoglobin 13.6 appears unremarkable.  B12 was 99 in the past and she is being treated with IM supplementation.  She has been referred to pain medicine.  I saw patient in March 2022 for multiple neurologic symptoms including brain fog, tremors, and multiple other neurologic symptoms in the setting of hyperthyroidism and severe B12 deficiency.  Today she has been referred for migraines.  Prior MRI of the lumbar spine 08/2020 was normal.  MRI of the brain in April 2022 was normal.  MRI of the cervical spine April 2022 was unremarkable essentially normal very minimal arthritic changes no foraminal or central stenosis.  Patient is here for migraines. Daily migraines for a year. They start on the right temporoparietal area. She has had they in the past started 20 years ago and they are the same quality and severity and frequency as she has had in the past, she has a kaleidoscope in vision of both eyes, pulsating, pounding. Throbbing, light sensitivity, nausea, movement makes it worse, they have waxed and waned but  8 years ago had similar frequency and severity. They can last 24-72 hours. No associated focal neurologic symptoms. Rizatriptan helps. She has seen [facility]. Moderate to  severe migraines. Unknown triggers.No other focal neurologic deficits, associated symptoms, inciting events or modifiable factors  Reviewed notes, labs and imaging from outside physicians, which showed:  From a thorough review of records, medications tried that can be used in migraine management include: Meloxicam, rizatriptan, Keppra, tizanidine, Depakote (allergy), Lyrica (allergies), gabapentin (allergy), Wellbutrin (allergy), topiramate contraindicated due to history of kidney stones, metoprolol, trazodone, Effexor, sumatriptan, amitriptyline.   MRI brain 09/30/2020: normal, personally reviewed imaging and agree.  MRI cervical spine 09/30/2020: IMPRESSION: This MRI of the cervical spine with and without contrast shows the following: 1.   Spinal cord appears normal, before and after contrast 2.   Mild disc degenerative changes at C4-C5, C5-C6 and C6-C7 that does not lead to nerve root compression or spinal stenosis. 3.   Normal enhancement pattern.  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 Pertinent negatives and positives per HPI. All others negative   HPI 08/31/2020:  Lynn Lyons is a 42 y.o. female here as requested by [name], NP for brain fog, tremors, and multiple other neurologic symptoms in the setting of hyperthyroidism and severe B12 deficiency.  She has recently found TSH issues and word-finding issues for several years. Also recently found B12 99. Ongoing for several years, worsening. She has tremors. She has started falling. Cold flashes across the top of her feet, she will lose her balance, she fell yesterday, she has a lot of trouble staying on track with conversation, she will forget if she is told something 3-5 minutes later, she has tremors (part of the graves disease) that was diagnosed a month ago, she has to write a note for her kids' school, her hand will start writing something that is different  than in her brain, she wills tart writing something that has nothing to do, it makes sense and it is a good sentence (not word salad or bad spelling or gibberish), she denies distraction. She constantly has to scribble things out. Trouble following directions, she will start on step one and steo two and she has to reread the instructions because she can;t remember it, when she reads novels she has to reread things, no learning disabilities or ADHD as a child, she skipped the 8th grade, this is a huge change for her, she would remember everything (her husband hated it), she feels she can barely find enough words to talk often. She gets lost in conversation. She loses a thread, becoming a struggle. No hx of dementia in the family. She has been to Rheumatology and dxed with Fibromyalgia. She has ain all over her body, it hurts to even touch her skin, extremely painful, muscle and joint pain, muscles painful she can;t have someone touch her arm, back, thigh, in the back of the head, sensitivity of the skin and pain of the muscles and joints. She will have numbness in the feet. No other focal neurologic deficits, associated symptoms, inciting events or modifiable factors.  Reviewed notes, labs and imaging from outside physicians, which showed:  MRI 08/2020 lumbar spine is normal, personally reviewed imaging, no reason seen for her imbalance/ataxia   Observations/Objective:  Generalized: Well developed, in no acute distress  Mentation: Alert oriented to time, place, history taking. Follows all commands speech and language fluent  Assessment and Plan:  42 y.o. year old female  has a past medical history of Allergic rhinitis, Basal cell carcinoma (07/17/2019), Chronic migraine with aura (10/15/2018), Chronic pain syndrome (03/03/2021), Cigarette smoker (06/17/2019), Dry mouth (09/08/2020), Fibromyalgia, Graves disease, Hyperthyroidism (08/17/2020), IBS (irritable bowel syndrome), Incontinence without sensory  awareness, Major depressive disorder with single episode, Migraines, Musculoskeletal back pain (03/03/2021), Patellofemoral pain syndrome of both knees (09/08/2020), Post viral syndrome (07/03/2019), and Thyroid cyst. here with  No diagnosis found.   Alexandrea reports headaches are significantly improved. She will continue [place]. Healthy lifestyle habits encouraged. She will follow up in 1 year.   No orders of the defined types were placed in this encounter.   No orders of the defined types were placed in this encounter.    Follow Up Instructions:  I discussed the assessment and treatment plan with the patient. The patient was provided an opportunity to ask questions and all were answered. The patient agreed with the plan and demonstrated an understanding of the instructions.   The patient was advised to call back or seek an in-person evaluation if the symptoms worsen or if the condition fails to improve as anticipated.  I provided 15 minutes of non-face-to-face time during this encounter. Patient located at their place of residence during Mychart visit. Provider is in the office.    Shawnie Dapper, NP

## 2023-03-30 ENCOUNTER — Telehealth: Payer: Self-pay

## 2023-03-30 NOTE — Telephone Encounter (Signed)
*  [facility]  Pharmacy Patient Advocate Encounter   Received notification from CoverMyMeds that prior authorization for Emgality 120MG /ML auto-injectors (migraine)  is required/requested.   Insurance verification completed.   The patient is insured through [facility] .   Per test claim: PA required; PA submitted to [facility] via CoverMyMeds Key/confirmation #/EOC [facility] Status is pending

## 2023-04-06 NOTE — Telephone Encounter (Signed)
Pharmacy Patient Advocate Encounter  Received notification from [facility] Medicaid that Prior Authorization for Emgality 120MG /ML auto-injectors (migraine) has been DENIED.  Full denial letter will be uploaded to the media tab. See denial reason below.  The following criteria from the health plan guideline, Migraine Therapy Calcitonin Gene-Related Inhibitors, must be met before we can approve this request. Please send [place] supporting chart notes and lab results. Beneficiaries that are women of childbearing age continue to be monitored for pregnancy status  PA #/Case ID/Reference #: [id number]  Please be advised we currently do not have a Pharmacist to review denials, therefore you will need to process appeals accordingly as needed. Thanks for your support at this time. Contact for appeals are as follows: Phone: [phone] for a peer-to-peer, Fax: [phone]  Last day to appeal is 05-29-2023

## 2023-04-09 ENCOUNTER — Other Ambulatory Visit (HOSPITAL_COMMUNITY): Payer: Self-pay

## 2023-04-09 NOTE — Telephone Encounter (Signed)
Appeal has been submitted. Will advise when response is received or follow up in 1 week. Please be advised that most companies may take 30 days to make a decision.   Dellie Burns, PharmD Clinical Pharmacist Med Access Team [facility]  Direct Dial: [phone]

## 2023-04-09 NOTE — Telephone Encounter (Signed)
I spoke with the patient. She has had a total hysterectomy.

## 2023-04-12 ENCOUNTER — Other Ambulatory Visit (HOSPITAL_COMMUNITY): Payer: Self-pay

## 2023-12-31 ENCOUNTER — Other Ambulatory Visit: Payer: Self-pay | Admitting: Student

## 2023-12-31 DIAGNOSIS — Z1231 Encounter for screening mammogram for malignant neoplasm of breast: Secondary | ICD-10-CM

## 2024-01-11 ENCOUNTER — Encounter

## 2024-02-05 ENCOUNTER — Ambulatory Visit
Admission: RE | Admit: 2024-02-05 | Discharge: 2024-02-05 | Disposition: A | Discharge status: Home / Self Care | Source: Ambulatory Visit | Attending: Student | Admitting: Student

## 2024-02-05 DIAGNOSIS — Z1231 Encounter for screening mammogram for malignant neoplasm of breast: Secondary | ICD-10-CM
